# Patient Record
Sex: Female | Born: 1975 | Race: Asian | Hispanic: No | Marital: Married | Smoking: Never smoker
Health system: Southern US, Community
[De-identification: ages and names within clinical notes are randomized; demographics above are authoritative.]

## PROBLEM LIST (undated history)

## (undated) ENCOUNTER — Inpatient Hospital Stay (HOSPITAL_COMMUNITY): Payer: Self-pay

## (undated) DIAGNOSIS — S93402A Sprain of unspecified ligament of left ankle, initial encounter: Secondary | ICD-10-CM

## (undated) DIAGNOSIS — IMO0002 Reserved for concepts with insufficient information to code with codable children: Secondary | ICD-10-CM

## (undated) DIAGNOSIS — Z789 Other specified health status: Secondary | ICD-10-CM

## (undated) DIAGNOSIS — Z87440 Personal history of urinary (tract) infections: Secondary | ICD-10-CM

## (undated) HISTORY — PX: NO PAST SURGERIES: SHX2092

## (undated) HISTORY — DX: Sprain of unspecified ligament of left ankle, initial encounter: S93.402A

## (undated) HISTORY — DX: Reserved for concepts with insufficient information to code with codable children: IMO0002

## (undated) HISTORY — DX: Personal history of urinary (tract) infections: Z87.440

---

## 2001-04-21 ENCOUNTER — Encounter: Payer: Self-pay | Admitting: General Practice

## 2001-04-21 ENCOUNTER — Encounter: Admission: RE | Admit: 2001-04-21 | Discharge: 2001-04-21 | Payer: Self-pay | Admitting: General Practice

## 2001-04-23 ENCOUNTER — Encounter: Payer: Self-pay | Admitting: General Practice

## 2001-04-23 ENCOUNTER — Encounter: Admission: RE | Admit: 2001-04-23 | Discharge: 2001-04-23 | Payer: Self-pay | Admitting: General Practice

## 2005-06-02 ENCOUNTER — Inpatient Hospital Stay (HOSPITAL_COMMUNITY): Admission: AD | Admit: 2005-06-02 | Discharge: 2005-06-03 | Payer: Self-pay | Admitting: *Deleted

## 2005-06-17 ENCOUNTER — Inpatient Hospital Stay (HOSPITAL_COMMUNITY): Admission: AD | Admit: 2005-06-17 | Discharge: 2005-06-17 | Payer: Self-pay | Admitting: Obstetrics and Gynecology

## 2005-08-06 ENCOUNTER — Other Ambulatory Visit: Admission: RE | Admit: 2005-08-06 | Discharge: 2005-08-06 | Payer: Self-pay | Admitting: Obstetrics and Gynecology

## 2005-10-28 ENCOUNTER — Inpatient Hospital Stay (HOSPITAL_COMMUNITY): Admission: AD | Admit: 2005-10-28 | Discharge: 2005-10-31 | Payer: Self-pay | Admitting: Obstetrics and Gynecology

## 2005-12-05 ENCOUNTER — Inpatient Hospital Stay (HOSPITAL_COMMUNITY): Admission: AD | Admit: 2005-12-05 | Discharge: 2005-12-07 | Payer: Self-pay | Admitting: Obstetrics and Gynecology

## 2005-12-11 ENCOUNTER — Inpatient Hospital Stay (HOSPITAL_COMMUNITY): Admission: AD | Admit: 2005-12-11 | Discharge: 2005-12-12 | Payer: Self-pay | Admitting: Obstetrics and Gynecology

## 2006-11-25 IMAGING — CR DG CHEST 2V
2 series · 2 of 2 positions shown · non-contrast
Comparison: 06/02/2005

CLINICAL DATA: Shortness of breath

[view not recorded (1 of 2)]
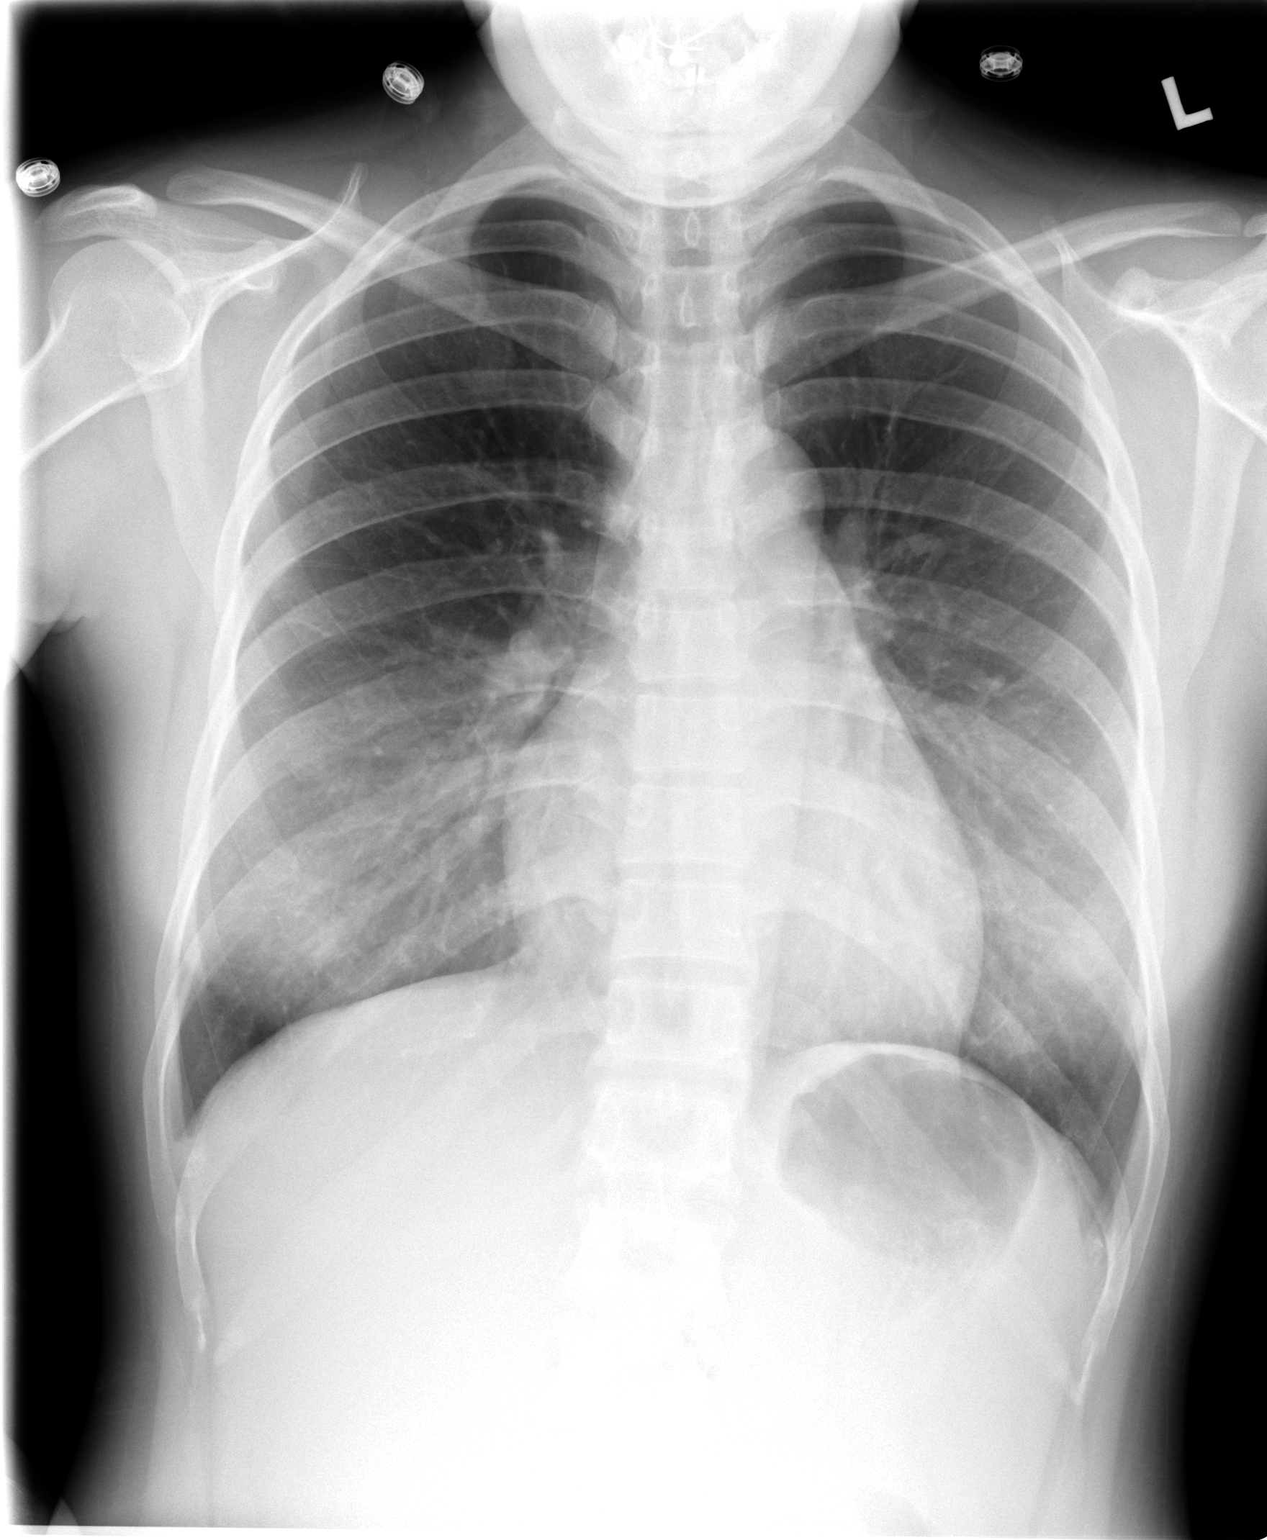

[view not recorded (2 of 2)]
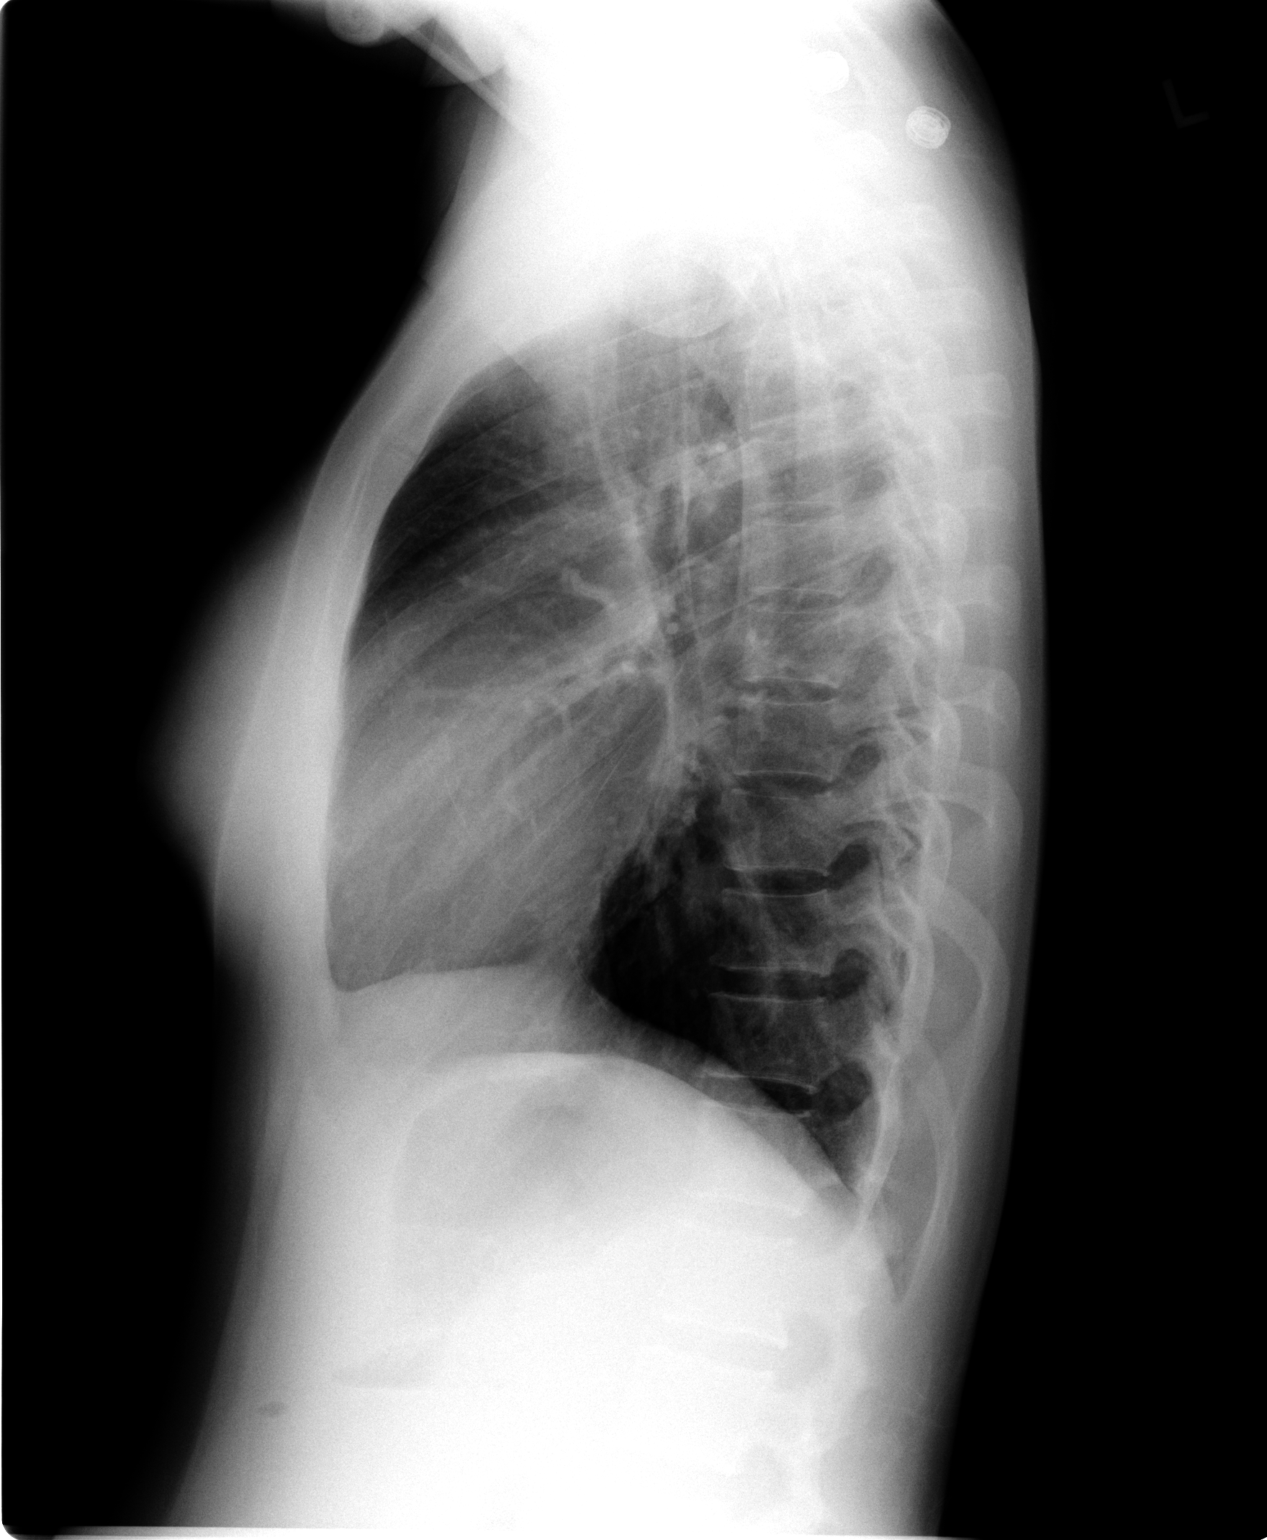

[2 of 2 positions shown; findings below may reference images not displayed]

CHEST - 2 VIEW:

The lungs are clear.  No pleural effusion. The cardiopericardial silhouette is
within normal limits for size.  Visualized bony structures of the thorax are
intact.
IMPRESSION: No acute cardiopulmonary process

## 2009-01-15 ENCOUNTER — Observation Stay (HOSPITAL_COMMUNITY): Admission: AD | Admit: 2009-01-15 | Discharge: 2009-01-16 | Payer: Self-pay | Admitting: Obstetrics and Gynecology

## 2010-06-05 LAB — CBC
HCT: 30.7 % — ABNORMAL LOW (ref 36.0–46.0)
HCT: 31.9 % — ABNORMAL LOW (ref 36.0–46.0)
Hemoglobin: 10.2 g/dL — ABNORMAL LOW (ref 12.0–15.0)
Hemoglobin: 9.8 g/dL — ABNORMAL LOW (ref 12.0–15.0)
MCHC: 31.7 g/dL (ref 30.0–36.0)
RDW: 15.1 % (ref 11.5–15.5)
RDW: 15.2 % (ref 11.5–15.5)
WBC: 14.2 10*3/uL — ABNORMAL HIGH (ref 4.0–10.5)

## 2010-06-05 LAB — ABO/RH: ABO/RH(D): B POS

## 2010-07-19 NOTE — Discharge Summary (Signed)
NAMEROSEY, EIDE NO.:  [id number]   MEDICAL RECORD NO.:  [id number]          PATIENT TYPE:  INP   LOCATION:  9115                          FACILITY:  [facility]   PHYSICIAN:  Malachi Pro. Ambrose Mantle, M.D. DATE OF BIRTH:  [birth year]   DATE OF ADMISSION:  12/05/2005  DATE OF DISCHARGE:  12/07/2005                                 DISCHARGE SUMMARY   A 35 year old Asian female para 0, gravida 1.  By a 14-week ultrasound, EDC  12/15/2005, presented with complaining of leaking fluid since 4:00 p.m. on  12/04/2005, occasional contractions, no vaginal bleeding, good fetal  movement.  She was evaluated Maternity Admission Unit with grossly ruptured  membranes, positive Nitrazine vaginal exam showed the cervix being 1 cm, 90%  vertex at a -1.   The patient was admitted and started on Pitocin.  Blood group and type B+,  negative antibody, RPR nonreactive, rubella immune, hepatitis B surface  antigen negative, GC and chlamydia negative, quad screen normal.  1-hour  Glucola 91.  Group B strep negative.  The patient transferred to our group  at approximately 21 weeks.  She had UTIs treated with Macrobid and  pyelonephritis at 33 weeks.   PAST MEDICAL HISTORY:  Negative.   SURGICAL HISTORY:  Negative.   ALLERGIES:  Rash with an antibiotic.   MEDICATIONS:  No medications.   On admission, she was afebrile with normal vital signs.  Heart/Lungs: Were  normal.  Abdomen was soft and gravid.  Estimated fetal weight about 7  pounds.   The patient, by 9:08 a.m., had 12 milliunits of Pitocin per minute going,  contractions every 2-3 minutes.  Cervix was fingertip and 80% vertex and -2.  She was 2 cm at 10:50 a.m., 6-7 cm at 12:25 p.m. and fully dilated 12:40  p.m.  She pushed well and developed a fetal bradycardia.  A left  mediolateral episiotomy was done, and the patient delivered spontaneously OA  over a left mediolateral episiotomy with third-degree laceration and  bilateral vaginal  lacerations by Dr. Ambrose Mantle.  A living female infant 7  pounds 1 ounce, Apgars 7 at 1 and 9 at 5 minutes.  Placenta was intact.  Uterus normal.  Blood loss about 400 cc.  All lacerations were sutured.  It  took four 3-0 Vicryl sutures for the lacerations in episiotomy and two 2-0  Vicryl sutures for the sphincter.   Postoperatively and postpartum the patient did well.  Her only complaint  initially was soreness of the perineum.  This improved, and on the second  postpartum day she was ready for discharge.   LABORATORY DATA:  Laboratory data showed initial hemoglobin 11.7.  Follow-up  9.2, white count 10,300, platelet count 277,000.  RPR was nonreactive.   FINAL DIAGNOSIS:  Intrauterine pregnancy at approximately 39 weeks with  premature rupture of membranes, delivered OA.   OPERATIONS:  Spontaneous delivery OA,  left mediolateral episiotomy with  third-degree extension and bilateral vaginal lacerations, all  repaired.   FINAL CONDITION:  Improved.   Instructions include our regular discharge instruction booklet.  The patient  is given a prescription of Percocet 5/325, twenty-four tablets, one every 4-  6 hours as needed for pain.  She is advised to return to the office in 6  weeks for follow-up examination.  Call with any problems.  She is also  advised to take stool softeners to keep her stools soft.      Malachi Pro. Ambrose Mantle, M.D.  Electronically Signed     TFH/MEDQ  D:  12/07/2005  T:  12/08/2005  Job:  [id number]

## 2010-07-19 NOTE — Discharge Summary (Signed)
NAMEJASMEET, [name] Boyer NO.:  [id number]   MEDICAL RECORD NO.:  [id number]          PATIENT TYPE:  INP   LOCATION:  9159                          [facility]ACILITY:  [facility]   PHYSICIAN:  Sherron Monday, MD        DATE OF BIRTH:  31-Dec-1975   DATE OF ADMISSION:  10/28/2005  DATE OF DISCHARGE:  10/31/2005                                 DISCHARGE SUMMARY   ADMITTING DIAGNOSES:  1. Intrauterine pregnancy at 32+ weeks.  2. Pyelo.   DISCHARGE DIAGNOSES:  1. Intrauterine pregnancy at 32+ weeks.  2. Pyelo, resolved.   HISTORY OF PRESENT ILLNESS:  A 35 year old G1 at 13 and 6 with complaints of  dysuria since Sunday.  She also complains of frequency and has a history of  a UTI.  She states baby has been moving well.  No loss of fluid, no vaginal  bleeding, some occasional contractions.  She was seen in the office today,  afebrile, and she was attempted to be treated outpatient with Keflex;  however, that evening they called the physician on call with a temp of  103.1.  She presented and was admitted for pyelo secondary to costovertebral  angle tenderness and fever, as well as unsuccessful management at home.   PAST MEDICAL HISTORY:  Not significant.   PAST SURGICAL HISTORY:  Not significant.   OB/GYN HISTORY:  G1 is present pregnancy, only complicated by UTI and now  pyelonephritis.  No history of any abnormal Pap smears or sexually  transmitted diseases.   MEDICATIONS:  Prenatal vitamins.   ALLERGIES:  MACROBID - causes itching and hives.   SOCIAL HISTORY:  She denies alcohol, tobacco, or drugs and is married.   FAMILY HISTORY:  Not significant for diabetes or hypertension.   PRENATAL LABS:  Hemoglobin 11.0, platelets 283, blood type B positive,  antibody screen negative, RPR nonreactive, hepatitis B surface antigen  negative, rubella immune.  Hemoglobin and electrophoresis within normal  limits.  HIV declined.  GC negative, Chlamydia negative, and a cystic  fibrosis  screen negative.   PHYSICAL EXAMINATION:  VITAL SIGNS:  She is afebrile at presentation;  however, her baby was tachycardic in the 160s, however, reactive.  She had  right costovertebral angle tenderness.  VAGINAL EXAM:  Closed, thick, and high.   She had an ultrasound to confirm her dates of her pregnancy.  On admission,  her white count was 11.4, hemoglobin 10.4, platelets 220 with 85%  polymorphonucleotides.  She was admitted for Rocephin and underwent a renal  ultrasound as well.  Her hospital course was relatively uncomplicated.  She  spiked a fever to 101.2 at approximately 4:00 in the morning on August 30.  She had had a reactive NST daily when she was here and had received the  Rocephin and noted decreasing right-sided costovertebral angle tenderness.  During her hospitalization, she did complain of headache.  Her vital signs  were stable.  She was treated successfully with Vicodin on several  occasions.  She complained of constipation as well and was treated with milk-  of-magnesia with minimal relief.  Also, she was encouraged to start a  regimen with Colace and increasing fluids at home.  She, on a urine culture,  had mixed urogenital flora, a normal renal ultrasound, and on hospital day  #3, she was discharged to home having been afebrile for greater than 36  hours, with improved costovertebral angle pain, endorsing good fetal  movement.  No loss of fluid, no bleeding, and occasional contractions.  She  was discharged to complete her prescription of Keflex as prescribed in the  office on the 28th.  She was also discharged with Protonix versus Zantac for  her heartburn, and she was given a prescription for Protonix but told either  Protonix or over-the-counter Zantac would be a good choice.  She was also  given a prescription for Colace.  Follow up appointment on September 5 at  9:45 with myself, and 15 Vicodin for headaches.  She is to call with any  questions or problems and  will continue to check her temperatures at home.  She and her husband voice understanding to these instructions.      Sherron Monday, MD  Electronically Signed     JB/MEDQ  D:  10/31/2005  T:  11/01/2005  Job:  [id number]

## 2010-09-03 ENCOUNTER — Other Ambulatory Visit (HOSPITAL_COMMUNITY): Payer: Self-pay | Admitting: Obstetrics and Gynecology

## 2010-09-03 ENCOUNTER — Ambulatory Visit (HOSPITAL_COMMUNITY)
Admission: RE | Admit: 2010-09-03 | Discharge: 2010-09-03 | Disposition: A | Discharge status: Home / Self Care | Payer: Medicaid Other | Source: Ambulatory Visit | Attending: Obstetrics and Gynecology | Admitting: Obstetrics and Gynecology

## 2010-09-03 DIAGNOSIS — Z3689 Encounter for other specified antenatal screening: Secondary | ICD-10-CM | POA: Insufficient documentation

## 2010-09-03 DIAGNOSIS — O365990 Maternal care for other known or suspected poor fetal growth, unspecified trimester, not applicable or unspecified: Secondary | ICD-10-CM | POA: Insufficient documentation

## 2010-09-04 ENCOUNTER — Inpatient Hospital Stay (HOSPITAL_COMMUNITY)
Admission: AD | Admit: 2010-09-04 | Discharge: 2010-09-06 | DRG: 775 | Disposition: A | Discharge status: Home / Self Care | Payer: Medicaid Other | Source: Ambulatory Visit | Attending: Obstetrics and Gynecology | Admitting: Obstetrics and Gynecology

## 2010-09-04 DIAGNOSIS — O4290 Premature rupture of membranes, unspecified as to length of time between rupture and onset of labor, unspecified weeks of gestation: Principal | ICD-10-CM | POA: Diagnosis present

## 2010-09-04 LAB — CBC
MCH: 22.3 pg — ABNORMAL LOW (ref 26.0–34.0)
MCHC: 33.1 g/dL (ref 30.0–36.0)
Platelets: 220 10*3/uL (ref 150–400)
RDW: 15.1 % (ref 11.5–15.5)

## 2010-09-04 LAB — RPR: RPR Ser Ql: NONREACTIVE

## 2010-09-05 LAB — CBC
MCH: 21.9 pg — ABNORMAL LOW (ref 26.0–34.0)
MCHC: 32.5 g/dL (ref 30.0–36.0)
Platelets: 183 10*3/uL (ref 150–400)

## 2010-10-03 NOTE — Discharge Summary (Signed)
NAMESARYA, LINENBERGER NO.:  [id number]  MEDICAL RECORD NO.:  [id number]  LOCATION:  9146                          [facility]ACILITY:  [facility]  PHYSICIAN:  Malachi Pro. Ambrose Mantle, M.D. DATE OF BIRTH:  [birth year]  DATE OF ADMISSION:  09/04/2010 DATE OF DISCHARGE:  09/06/2010                              DISCHARGE SUMMARY   HISTORY OF PRESENT ILLNESS:  This is a 35 year old Asian female, para 1- 0-1-1 gravida 3, EDC September 13, 2010, admitted with premature rupture of membranes at 5:58 a.m. on September 04, 2010.  Blood group and type B positive, prenatal records said B negative, but in fact it was B positive, negative antibody.  Pap smear normal, rubella immune, RPR nonreactive.  Urine culture negative, hepatitis B surface antigen negative, HIV negative, GC and Chlamydia negative, cystic fibrosis negative.  Genetic screens declined, 1-hour Glucola 125 and group B strep negative.  Vaginal ultrasound on February 06, 2010 showed an estimated gestational age of [redacted] weeks and 2 days with [facility] of September 13, 2010.  Prenatal care was uncomplicated.  Ultrasound in February 2012 showed an estimated gestational age of [redacted] weeks and 5 days with [facility] of September 11, 2010.  At 5:58 a.m. on the day of admission the patient had spontaneous rupture of membranes at home.  She came to Maternity Admission Unit and the fern was positive on September 03, 2010.  Fundal height lagged.  Ultrasound showed an estimated fetal weight in the 62nd percentile.  Nonstress test was reactive.  PAST MEDICAL HISTORY:  ALLERGIES.:  None.  MEDICATION:  For UTI, the patient does not remember the name.  ILLNESSES:  Pyelonephritis in her last pregnancy.  OPERATIONS:  None.  HABITS:  Alcohol, tobacco, and drugs none.  FAMILY HISTORY:  Negative.  OBSTETRICAL HISTORY:  In October 2007, she delivered a 7 pounds 1 ounce female vaginally with a left mediolateral episiotomy, third-degree extension, bilateral vaginal laceration.  PHYSICAL EXAM  ON ADMISSION:  VITAL SIGNS:  Normal. HEART/LUNGS:  Normal. ABDOMEN: Soft.  Fundal height 35 cm on June 12, 33 cm on September 03, 2010. Cervix was 2 cm, 50% vertex at -2.  Crist Fat was positive.  The patient was admitted for delivery.  After admission to the hospital the patient was begun on Pitocin.  By 10:15 a.m., her cervix was 2 cm, 70%.  Contractions every 2-4 minutes.  She progressed to complete dilatation and pushed for 15 minutes and delivered a living female infant 6 pounds 12 ounces by Dr. Ellyn Hack, Apgars were 9 at 1 and 9 at 5 minutes.  Infant was female.  Placenta was expressed intact.  Her right labial and second-degree perineal lacerations were repaired with 3-0 Vicryl Rapide in typical fashion.  Blood loss less than 500 mL. Postpartum, the patient did well and was discharged on the second postpartum day.  Laboratory data showed initial hemoglobin 11.7, hematocrit 35.3, white count 11,100, platelet count 220,000.  Followup hemoglobin 9.4, the patient was B positive, RPR was nonreactive.  FINAL DIAGNOSIS:  Intrauterine pregnancy at 38 weeks and 5 days. Delivered vertex, premature rupture of membranes.  OPERATIONS:  Spontaneous delivery of vertex, repair of right labial  and second-degree perineal lacerations.  FINAL CONDITION:  Improved.  INSTRUCTIONS:  Our regular discharge instruction booklet.  The patient is advised to return to the office in 6 weeks for followup examination. Percocet 5/325 30 tablets 1 every 4-6 hours as needed for pain and Motrin 600 mg 30 tablets one every 6 hours as needed for pain given at discharge.  The patient is advised to watch for signs or symptoms of postpartum depression.     Malachi Pro. Ambrose Mantle, M.D.     TFH/MEDQ  D:  09/06/2010  T:  09/06/2010  Job:  [id number]  Electronically Signed by Tracey Harries M.D. on 10/03/2010 08:18:48 AM

## 2013-08-19 ENCOUNTER — Emergency Department (HOSPITAL_COMMUNITY)
Admission: EM | Admit: 2013-08-19 | Discharge: 2013-08-20 | Disposition: A | Discharge status: Home / Self Care | Payer: BC Managed Care – PPO | Attending: Emergency Medicine | Admitting: Emergency Medicine

## 2013-08-19 ENCOUNTER — Encounter (HOSPITAL_COMMUNITY): Payer: Self-pay | Admitting: Emergency Medicine

## 2013-08-19 DIAGNOSIS — O039 Complete or unspecified spontaneous abortion without complication: Secondary | ICD-10-CM | POA: Insufficient documentation

## 2013-08-19 NOTE — ED Notes (Signed)
Pt in stating she had a positive pregnancy test last week, this morning she noted vaginal spotting, tonight around 1930 heavier bleeding started, pt states bleeding continues to be heavy with multiple clots. Unsure how many weeks pregnant she is, c/o lower abdominal cramping.

## 2013-08-20 ENCOUNTER — Emergency Department (HOSPITAL_COMMUNITY): Payer: BC Managed Care – PPO

## 2013-08-20 LAB — CBC WITH DIFFERENTIAL/PLATELET
BASOS PCT: 1 % (ref 0–1)
Basophils Absolute: 0.1 10*3/uL (ref 0.0–0.1)
EOS ABS: 0.2 10*3/uL (ref 0.0–0.7)
EOS PCT: 2 % (ref 0–5)
HEMATOCRIT: 32.4 % — AB (ref 36.0–46.0)
HEMOGLOBIN: 10.5 g/dL — AB (ref 12.0–15.0)
Lymphocytes Relative: 30 % (ref 12–46)
Lymphs Abs: 2.6 10*3/uL (ref 0.7–4.0)
MCH: 21 pg — AB (ref 26.0–34.0)
MCHC: 32.4 g/dL (ref 30.0–36.0)
MCV: 64.8 fL — ABNORMAL LOW (ref 78.0–100.0)
MONO ABS: 0.6 10*3/uL (ref 0.1–1.0)
Monocytes Relative: 7 % (ref 3–12)
NEUTROS ABS: 5.1 10*3/uL (ref 1.7–7.7)
NEUTROS PCT: 60 % (ref 43–77)
Platelets: 202 10*3/uL (ref 150–400)
RBC: 5 MIL/uL (ref 3.87–5.11)
RDW: 14.3 % (ref 11.5–15.5)
SMEAR REVIEW: ADEQUATE
WBC: 8.6 10*3/uL (ref 4.0–10.5)

## 2013-08-20 LAB — URINALYSIS, ROUTINE W REFLEX MICROSCOPIC
Glucose, UA: NEGATIVE mg/dL
KETONES UR: 40 mg/dL — AB
NITRITE: POSITIVE — AB
PROTEIN: 100 mg/dL — AB
Specific Gravity, Urine: 1.025 (ref 1.005–1.030)
Urobilinogen, UA: 1 mg/dL (ref 0.0–1.0)
pH: 6.5 (ref 5.0–8.0)

## 2013-08-20 LAB — BASIC METABOLIC PANEL
BUN: 12 mg/dL (ref 6–23)
CHLORIDE: 101 meq/L (ref 96–112)
CO2: 24 meq/L (ref 19–32)
CREATININE: 0.67 mg/dL (ref 0.50–1.10)
Calcium: 8.8 mg/dL (ref 8.4–10.5)
GFR calc Af Amer: >90 mL/min (ref >90)
GFR calc non Af Amer: >90 mL/min (ref >90)
GLUCOSE: 107 mg/dL — AB (ref 70–99)
Potassium: 3.3 mEq/L — ABNORMAL LOW (ref 3.7–5.3)
SODIUM: 140 meq/L (ref 137–147)

## 2013-08-20 LAB — WET PREP, GENITAL
Clue Cells Wet Prep HPF POC: NONE SEEN
Trich, Wet Prep: NONE SEEN
YEAST WET PREP: NONE SEEN

## 2013-08-20 LAB — TYPE AND SCREEN
ABO/RH(D): B POS
Antibody Screen: NEGATIVE

## 2013-08-20 LAB — URINE MICROSCOPIC-ADD ON

## 2013-08-20 LAB — HCG, QUANTITATIVE, PREGNANCY: HCG, BETA CHAIN, QUANT, S: 14341 m[IU]/mL — AB (ref <5)

## 2013-08-20 LAB — ABO/RH: ABO/RH(D): B POS

## 2013-08-20 MED ORDER — ACETAMINOPHEN 325 MG PO TABS
650.0000 mg | ORAL_TABLET | Freq: Once | ORAL | Status: AC
  Administered 2013-08-20: 650 mg via ORAL
  Filled 2013-08-20: qty 2

## 2013-08-20 NOTE — ED Notes (Signed)
Pt to us with  chaperone

## 2013-08-20 NOTE — ED Notes (Signed)
The pt is c/o mild cramping in her lower abd.  Alert skin warm and dry.  No distress.  Family [place] the [place]

## 2013-08-20 NOTE — ED Provider Notes (Signed)
CSN: [id number]     Arrival date & time 08/19/13  2349 History   First MD Initiated Contact with Patient 08/19/13 2353     Chief Complaint  Patient presents with  . Vaginal Bleeding  . Pregnant      (Consider location/radiation/quality/duration/timing/severity/associated sxs/prior Treatment) HPI Comments: Patient is a 38 year old 305P2 female with no significant past medical history who presents with abdominal pain and vaginal bleeding that started this morning. Symptoms started gradually and progressively worsened since the onset. Patient reports having a positive pregnancy test last week. This morning, patient started having lower abdominal cramping and vaginal spotting. Over the course of the day, the bleeding became heavier and the cramping became worse. No alleviating factors. Patient reports a history of 2 previous miscarriages.   Patient is a 38 y.o. female presenting with vaginal bleeding.  Vaginal Bleeding Associated symptoms: no abdominal pain, no dizziness, no dysuria, no fatigue, no fever and no nausea     History reviewed. No pertinent past medical history. No past surgical history on file. No family history on file. History  Substance Use Topics  . Smoking status: Not on file  . Smokeless tobacco: Not on file  . Alcohol Use: Not on file   OB History   Grav Para Term Preterm Abortions TAB SAB Ect Mult Living                 Review of Systems  Constitutional: Negative for fever, chills and fatigue.  HENT: Negative for trouble swallowing.   Eyes: Negative for visual disturbance.  Respiratory: Negative for shortness of breath.   Cardiovascular: Negative for chest pain and palpitations.  Gastrointestinal: Negative for nausea, vomiting, abdominal pain and diarrhea.  Genitourinary: Positive for vaginal bleeding and pelvic pain. Negative for dysuria and difficulty urinating.  Musculoskeletal: Negative for arthralgias and neck pain.  Skin: Negative for color change.   Neurological: Negative for dizziness and weakness.  Psychiatric/Behavioral: Negative for dysphoric mood.      Allergies  Review of patient's allergies indicates no known allergies.  Home Medications   Prior to Admission medications   Not on File   BP 104/65  Pulse 78  Temp(Src) 98 F (36.7 C) (Oral)  Resp 20  SpO2 100% Physical Exam  Nursing note and vitals reviewed. Constitutional: She is oriented to person, place, and time. She appears well-developed and well-nourished. No distress.  HENT:  Head: Normocephalic and atraumatic.  Eyes: Conjunctivae and EOM are normal.  Neck: Normal range of motion.  Cardiovascular: Normal rate and regular rhythm.  Exam reveals no gallop and no friction rub.   No murmur heard. Pulmonary/Chest: Effort normal and breath sounds normal. She has no wheezes. She has no rales. She exhibits no tenderness.  Abdominal: Soft. She exhibits no distension. There is no tenderness. There is no rebound and no guarding.  No tenderness to palpation.   Genitourinary: Vagina normal.  Moderate amount of dark red blood in vagina with associated clots. I will unable to visualize the cervical os. Mild generalized tenderness on bimanual exam.   Musculoskeletal: Normal range of motion.  Neurological: She is alert and oriented to person, place, and time. Coordination normal.  Speech is goal-oriented. Moves limbs without ataxia.   Skin: Skin is warm and dry.  Psychiatric: She has a normal mood and affect. Her behavior is normal.    ED Course  Procedures (including critical care time) Labs Review Labs Reviewed  WET PREP, GENITAL - Abnormal; Notable for the following:  WBC, Wet Prep HPF POC FEW (*)    All other components within normal limits  CBC WITH DIFFERENTIAL - Abnormal; Notable for the following:    Hemoglobin 10.5 (*)    HCT 32.4 (*)    MCV 64.8 (*)    MCH 21.0 (*)    All other components within normal limits  BASIC METABOLIC PANEL - Abnormal;  Notable for the following:    Potassium 3.3 (*)    Glucose, Bld 107 (*)    All other components within normal limits  HCG, QUANTITATIVE, PREGNANCY - Abnormal; Notable for the following:    hCG, Beta Chain, Quant, S 14341 (*)    All other components within normal limits  URINALYSIS, ROUTINE W REFLEX MICROSCOPIC - Abnormal; Notable for the following:    Color, Urine RED (*)    APPearance TURBID (*)    Hgb urine dipstick LARGE (*)    Bilirubin Urine LARGE (*)    Ketones, ur 40 (*)    Protein, ur 100 (*)    Nitrite POSITIVE (*)    Leukocytes, UA MODERATE (*)    All other components within normal limits  GC/CHLAMYDIA PROBE AMP  URINE MICROSCOPIC-ADD ON  TYPE AND SCREEN  ABO/RH    Imaging Review No results found.   EKG Interpretation None      MDM   Final diagnoses:  Miscarriage    12:38 AM Labs and urinalysis pending. Patient will have tylenol for pain. Patient will have a pelvic exam followed by OB ultrasound. Vitals stable and patient afebrile.   Patient signed out to Dr. Norlene Campbelltter pending OB ultrasound.   Emilia BeckKaitlyn Szekalski, [place] JerseyPA-C 08/22/13 [phone]

## 2013-08-20 NOTE — Discharge Instructions (Signed)
Miscarriage A miscarriage is the sudden loss of an unborn baby (fetus) before the 20th week of pregnancy. Most miscarriages happen in the first 3 months of pregnancy. Sometimes, it happens before a woman even knows she is pregnant. A miscarriage is also called a "spontaneous miscarriage" or "early pregnancy loss." Having a miscarriage can be an emotional experience. Talk with your caregiver about any questions you may have about miscarrying, the grieving process, and your future pregnancy plans. CAUSES   Problems with the fetal chromosomes that make it impossible for the baby to develop normally. Problems with the baby's genes or chromosomes are most often the result of errors that occur, by chance, as the embryo divides and grows. The problems are not inherited from the parents.  Infection of the cervix or uterus.   Hormone problems.   Problems with the cervix, such as having an incompetent cervix. This is when the tissue in the cervix is not strong enough to hold the pregnancy.   Problems with the uterus, such as an abnormally shaped uterus, uterine fibroids, or congenital abnormalities.   Certain medical conditions.   Smoking, drinking alcohol, or taking illegal drugs.   Trauma.  Often, the cause of a miscarriage is unknown.  SYMPTOMS   Vaginal bleeding or spotting, with or without cramps or pain.  Pain or cramping in the abdomen or lower back.  Passing fluid, tissue, or blood clots from the vagina. DIAGNOSIS  Your caregiver will perform a physical exam. You may also have an ultrasound to confirm the miscarriage. Blood or urine tests may also be ordered. TREATMENT   Sometimes, treatment is not necessary if you naturally pass all the fetal tissue that was in the uterus. If some of the fetus or placenta remains in the body (incomplete miscarriage), tissue left behind may become infected and must be removed. Usually, a dilation and curettage (D and C) procedure is performed.  During a D and C procedure, the cervix is widened (dilated) and any remaining fetal or placental tissue is gently removed from the uterus.  Antibiotic medicines are prescribed if there is an infection. Other medicines may be given to reduce the size of the uterus (contract) if there is a lot of bleeding.  If you have Rh negative blood and your baby was Rh positive, you will need a Rh immunoglobulin shot. This shot will protect any future baby from having Rh blood problems in future pregnancies. HOME CARE INSTRUCTIONS   Your caregiver may order bed rest or may allow you to continue light activity. Resume activity as directed by your caregiver.  Have someone help with home and family responsibilities during this time.   Keep track of the number of sanitary pads you use each day and how soaked (saturated) they are. Write down this information.   Do not use tampons. Do not douche or have sexual intercourse until approved by your caregiver.   Only take over-the-counter or prescription medicines for pain or discomfort as directed by your caregiver.   Do not take aspirin. Aspirin can cause bleeding.   Keep all follow-up appointments with your caregiver.   If you or your partner have problems with grieving, talk to your caregiver or seek counseling to help cope with the pregnancy loss. Allow enough time to grieve before trying to get pregnant again.  SEEK IMMEDIATE MEDICAL CARE IF:   You have severe cramps or pain in your back or abdomen.  You have a fever.  You pass large blood clots (walnut-sized   or larger) ortissue from your vagina. Save any tissue for your caregiver to inspect.   Your bleeding increases.   You have a thick, bad-smelling vaginal discharge.  You become lightheaded, weak, or you faint.   You have chills.  MAKE SURE YOU:  Understand these instructions.  Will watch your condition.  Will get help right away if you are not doing well or get  worse. Document Released: 08/13/2000 Document Revised: 06/14/2012 Document Reviewed: 04/08/2011 [employer] Patient Information 2015 [employer]. This information is not intended to replace advice given to you by your health care provider. Make sure you discuss any questions you have with your health care provider.  

## 2013-08-22 LAB — GC/CHLAMYDIA PROBE AMP
CT Probe RNA: NEGATIVE
GC Probe RNA: NEGATIVE

## 2013-08-23 NOTE — ED Provider Notes (Signed)
Medical screening examination/treatment/procedure(s) were conducted as a shared visit with non-physician practitioner(s) and myself.  I personally evaluated the patient during the encounter.   EKG Interpretation None     Results for orders placed during the hospital encounter of 08/19/13  WET PREP, GENITAL      Result Value Ref Range   Yeast Wet Prep HPF POC NONE SEEN  NONE SEEN   Trich, Wet Prep NONE SEEN  NONE SEEN   Clue Cells Wet Prep HPF POC NONE SEEN  NONE SEEN   WBC, Wet Prep HPF POC FEW (*) NONE SEEN  GC/CHLAMYDIA PROBE AMP      Result Value Ref Range   CT Probe RNA NEGATIVE  NEGATIVE   GC Probe RNA NEGATIVE  NEGATIVE  CBC WITH DIFFERENTIAL      Result Value Ref Range   WBC 8.6  4.0 - 10.5 K/uL   RBC 5.00  3.87 - 5.11 MIL/uL   Hemoglobin 10.5 (*) 12.0 - 15.0 g/dL   HCT 16.132.4 (*) 09.636.0 - 04.546.0 %   MCV 64.8 (*) 78.0 - 100.0 fL   MCH 21.0 (*) 26.0 - 34.0 pg   MCHC 32.4  30.0 - 36.0 g/dL   RDW 40.914.3  81.111.5 - 91.415.5 %   Platelets 202  150 - 400 K/uL   Neutrophils Relative % 60  43 - 77 %   Lymphocytes Relative 30  12 - 46 %   Monocytes Relative 7  3 - 12 %   Eosinophils Relative 2  0 - 5 %   Basophils Relative 1  0 - 1 %   Neutro Abs 5.1  1.7 - 7.7 K/uL   Lymphs Abs 2.6  0.7 - 4.0 K/uL   Monocytes Absolute 0.6  0.1 - 1.0 K/uL   Eosinophils Absolute 0.2  0.0 - 0.7 K/uL   Basophils Absolute 0.1  0.0 - 0.1 K/uL   RBC Morphology ELLIPTOCYTES     Smear Review PLATELETS APPEAR ADEQUATE    BASIC METABOLIC PANEL      Result Value Ref Range   Sodium 140  137 - 147 mEq/L   Potassium 3.3 (*) 3.7 - 5.3 mEq/L   Chloride 101  96 - 112 mEq/L   CO2 24  19 - 32 mEq/L   Glucose, Bld 107 (*) 70 - 99 mg/dL   BUN 12  6 - 23 mg/dL   Creatinine, Ser 7.820.67  0.50 - 1.10 mg/dL   Calcium 8.8  8.4 - 95.610.5 mg/dL   GFR calc non Af Amer >90  >90 mL/min   GFR calc Af Amer >90  >90 mL/min  HCG, QUANTITATIVE, PREGNANCY      Result Value Ref Range   hCG, Beta Chain, Quant, S 14341 (*) <5 mIU/mL   URINALYSIS, ROUTINE W REFLEX MICROSCOPIC      Result Value Ref Range   Color, Urine RED (*) YELLOW   APPearance TURBID (*) CLEAR   Specific Gravity, Urine 1.025  1.005 - 1.030   pH 6.5  5.0 - 8.0   Glucose, UA NEGATIVE  NEGATIVE mg/dL   Hgb urine dipstick LARGE (*) NEGATIVE   Bilirubin Urine LARGE (*) NEGATIVE   Ketones, ur 40 (*) NEGATIVE mg/dL   Protein, ur 213100 (*) NEGATIVE mg/dL   Urobilinogen, UA 1.0  0.0 - 1.0 mg/dL   Nitrite POSITIVE (*) NEGATIVE   Leukocytes, UA MODERATE (*) NEGATIVE  URINE MICROSCOPIC-ADD ON      Result Value Ref Range   WBC, UA 3-6  <  3 WBC/hpf   RBC / HPF TOO NUMEROUS TO COUNT  <3 RBC/hpf   Bacteria, UA RARE  RARE  TYPE AND SCREEN      Result Value Ref Range   ABO/RH(D) B POS     Antibody Screen NEG     Sample Expiration 08/23/2013    ABO/RH      Result Value Ref Range   ABO/RH(D) B POS     [place] Ob Comp Less 14 Wks  08/20/2013   CLINICAL DATA:  Vaginal bleeding.  EXAM: OBSTETRIC <14 WK US AND TRANSVAGINAL OB US  TECHNIQUE: Both transabdominal and transvaginal ultrasound examinations were performed for complete evaluation of the gestation as well as the maternal uterus, adnexal regions, and pelvic cul-de-sac. Transvaginal technique was performed to assess early pregnancy.  COMPARISON:  None.  FINDINGS: Intrauterine gestational sac:  None seen.  Yolk sac:  N/A  Embryo:  N/A  Maternal uterus/adnexae: The endometrial canal is diffusely thickened, measuring 2.1 cm, and contains heterogeneous material, likely reflecting clot. Nabothian cysts are seen at the cervix.  The ovaries are unremarkable in appearance. The right ovary measures 2.6 x 1.7 x 1.7 cm, while the left ovary measures 2.6 x 2.6 x 1.8 cm. No suspicious adnexal masses are seen; there is no evidence for ovarian torsion.  A small amount of free fluid is seen within the pelvic cul-de-sac.  IMPRESSION: No intrauterine gestational sac seen. No evidence for ectopic pregnancy. This likely reflects spontaneous  abortion. The endometrial canal is thickened and contains heterogeneous material, likely reflecting clot.   Electronically Signed   By: Roanna RaiderJeffery  Chang M.D.   On: 08/20/2013 02:23   [place] Ob Transvaginal  08/20/2013   CLINICAL DATA:  Vaginal bleeding.  EXAM: OBSTETRIC <14 WK US AND TRANSVAGINAL OB US  TECHNIQUE: Both transabdominal and transvaginal ultrasound examinations were performed for complete evaluation of the gestation as well as the maternal uterus, adnexal regions, and pelvic cul-de-sac. Transvaginal technique was performed to assess early pregnancy.  COMPARISON:  None.  FINDINGS: Intrauterine gestational sac:  None seen.  Yolk sac:  N/A  Embryo:  N/A  Maternal uterus/adnexae: The endometrial canal is diffusely thickened, measuring 2.1 cm, and contains heterogeneous material, likely reflecting clot. Nabothian cysts are seen at the cervix.  The ovaries are unremarkable in appearance. The right ovary measures 2.6 x 1.7 x 1.7 cm, while the left ovary measures 2.6 x 2.6 x 1.8 cm. No suspicious adnexal masses are seen; there is no evidence for ovarian torsion.  A small amount of free fluid is seen within the pelvic cul-de-sac.  IMPRESSION: No intrauterine gestational sac seen. No evidence for ectopic pregnancy. This likely reflects spontaneous abortion. The endometrial canal is thickened and contains heterogeneous material, likely reflecting clot.   Electronically Signed   By: Roanna RaiderJeffery  Chang M.D.   On: 08/20/2013 02:23    Pt with heavy bleeding, positive pregnancy test.  U/s reflects probable miscarriage.  Pt and husband updated on findings and need to f/u with her ob/gyn.  Pt is B+, no need for rhogam.  Olivia Mackielga M Otter, MD 08/23/13 [phone]

## 2013-11-11 ENCOUNTER — Emergency Department (HOSPITAL_COMMUNITY)
Admission: EM | Admit: 2013-11-11 | Discharge: 2013-11-11 | Disposition: A | Discharge status: Home / Self Care | Payer: BC Managed Care – PPO | Source: Home / Self Care | Attending: Emergency Medicine | Admitting: Emergency Medicine

## 2013-11-11 ENCOUNTER — Encounter (HOSPITAL_COMMUNITY): Payer: Self-pay | Admitting: Emergency Medicine

## 2013-11-11 DIAGNOSIS — S93409A Sprain of unspecified ligament of unspecified ankle, initial encounter: Secondary | ICD-10-CM

## 2013-11-11 DIAGNOSIS — S93402A Sprain of unspecified ligament of left ankle, initial encounter: Secondary | ICD-10-CM

## 2013-11-11 DIAGNOSIS — N3090 Cystitis, unspecified without hematuria: Secondary | ICD-10-CM

## 2013-11-11 LAB — POCT URINALYSIS DIP (DEVICE)
Bilirubin Urine: NEGATIVE
GLUCOSE, UA: NEGATIVE mg/dL
KETONES UR: NEGATIVE mg/dL
Nitrite: POSITIVE — AB
PROTEIN: 30 mg/dL — AB
SPECIFIC GRAVITY, URINE: 1.01 (ref 1.005–1.030)
Urobilinogen, UA: 0.2 mg/dL (ref 0.0–1.0)
pH: 6 (ref 5.0–8.0)

## 2013-11-11 LAB — POCT PREGNANCY, URINE: PREG TEST UR: NEGATIVE

## 2013-11-11 MED ORDER — DICLOFENAC SODIUM 75 MG PO TBEC: DELAYED_RELEASE_TABLET | ORAL | Status: DC

## 2013-11-11 MED ORDER — NITROFURANTOIN MONOHYD MACRO 100 MG PO CAPS
100.0000 mg | ORAL_CAPSULE | Freq: Two times a day (BID) | ORAL | Status: DC
Start: 2013-11-11 — End: 2013-12-27

## 2013-11-11 NOTE — ED Notes (Signed)
Reports poss UTI onset earlier this afternoon Sx include: dysuria, abd pain Denies f/v/n/d LMP = Today Also reports inversion of right foot/ankle inj Slow steady gait No signs of acute distress.

## 2013-11-11 NOTE — ED Provider Notes (Signed)
Medical screening examination/treatment/procedure(s) were performed by non-physician practitioner and as supervising physician I was immediately available for consultation/collaboration.  Dorinne Graeff, M.D.  Mitali Shenefield C Yael Coppess, MD 11/11/13 2136 

## 2013-11-11 NOTE — Discharge Instructions (Signed)
Ankle Sprain °An ankle sprain is an injury to the strong, fibrous tissues (ligaments) that hold the bones of your ankle joint together.  °CAUSES °An ankle sprain is usually caused by a fall or by twisting your ankle. Ankle sprains most commonly occur when you step on the outer edge of your foot, and your ankle turns inward. People who participate in sports are more prone to these types of injuries.  °SYMPTOMS  °· Pain in your ankle. The pain may be present at rest or only when you are trying to stand or walk. °· Swelling. °· Bruising. Bruising may develop immediately or within 1 to 2 days after your injury. °· Difficulty standing or walking, particularly when turning corners or changing directions. °DIAGNOSIS  °Your caregiver will ask you details about your injury and perform a physical exam of your ankle to determine if you have an ankle sprain. During the physical exam, your caregiver will press on and apply pressure to specific areas of your foot and ankle. Your caregiver will try to move your ankle in certain ways. An X-ray exam may be done to be sure a bone was not broken or a ligament did not separate from one of the bones in your ankle (avulsion fracture).  °TREATMENT  °Certain types of braces can help stabilize your ankle. Your caregiver can make a recommendation for this. Your caregiver may recommend the use of medicine for pain. If your sprain is severe, your caregiver may refer you to a surgeon who helps to restore function to parts of your skeletal system (orthopedist) or a physical therapist. °HOME CARE INSTRUCTIONS  °· Apply ice to your injury for 1-2 days or as directed by your caregiver. Applying ice helps to reduce inflammation and pain. °¨ Put ice in a plastic bag. °¨ Place a towel between your skin and the bag. °¨ Leave the ice on for 15-20 minutes at a time, every 2 hours while you are awake. °· Only take over-the-counter or prescription medicines for pain, discomfort, or fever as directed by  your caregiver. °· Elevate your injured ankle above the level of your heart as much as possible for 2-3 days. °· If your caregiver recommends crutches, use them as instructed. Gradually put weight on the affected ankle. Continue to use crutches or a cane until you can walk without feeling pain in your ankle. °· If you have a plaster splint, wear the splint as directed by your caregiver. Do not rest it on anything harder than a pillow for the first 24 hours. Do not put weight on it. Do not get it wet. You may take it off to take a shower or bath. °· You may have been given an elastic bandage to wear around your ankle to provide support. If the elastic bandage is too tight (you have numbness or tingling in your foot or your foot becomes cold and blue), adjust the bandage to make it comfortable. °· If you have an air splint, you may blow more air into it or let air out to make it more comfortable. You may take your splint off at night and before taking a shower or bath. Wiggle your toes in the splint several times per day to decrease swelling. °SEEK MEDICAL CARE IF:  °· You have rapidly increasing bruising or swelling. °· Your toes feel extremely cold or you lose feeling in your foot. °· Your pain is not relieved with medicine. °SEEK IMMEDIATE MEDICAL CARE IF: °· Your toes are numb or blue. °·   You have severe pain that is increasing. MAKE SURE YOU:   Understand these instructions.  Will watch your condition.  Will get help right away if you are not doing well or get worse. Document Released: 02/17/2005 Document Revised: 11/12/2011 Document Reviewed: 03/01/2011 [facility] Patient Information 2015 [place], [place]. This information is not intended to replace advice given to you by your health care provider. Make sure you discuss any questions you have with your health care provider.  Urinary Tract Infection Urinary tract infections (UTIs) can develop anywhere along your urinary tract. Your urinary tract is your  body's drainage system for removing wastes and extra water. Your urinary tract includes two kidneys, two ureters, a bladder, and a urethra. Your kidneys are a pair of bean-shaped organs. Each kidney is about the size of your fist. They are located below your ribs, one on each side of your spine. CAUSES Infections are caused by microbes, which are microscopic organisms, including fungi, viruses, and bacteria. These organisms are so small that they can only be seen through a microscope. Bacteria are the microbes that most commonly cause UTIs. SYMPTOMS  Symptoms of UTIs may vary by age and gender of the patient and by the location of the infection. Symptoms in Rhylen Shaheen women typically include a frequent and intense urge to urinate and a painful, burning feeling in the bladder or urethra during urination. Older women and men are more likely to be tired, shaky, and weak and have muscle aches and abdominal pain. A fever may mean the infection is in your kidneys. Other symptoms of a kidney infection include pain in your back or sides below the ribs, nausea, and vomiting. DIAGNOSIS To diagnose a UTI, your caregiver will ask you about your symptoms. Your caregiver also will ask to provide a urine sample. The urine sample will be tested for bacteria and white blood cells. White blood cells are made by your body to help fight infection. TREATMENT  Typically, UTIs can be treated with medication. Because most UTIs are caused by a bacterial infection, they usually can be treated with the use of antibiotics. The choice of antibiotic and length of treatment depend on your symptoms and the type of bacteria causing your infection. HOME CARE INSTRUCTIONS  If you were prescribed antibiotics, take them exactly as your caregiver instructs you. Finish the medication even if you feel better after you have only taken some of the medication.  Drink enough water and fluids to keep your urine clear or pale yellow.  Avoid caffeine,  tea, and carbonated beverages. They tend to irritate your bladder.  Empty your bladder often. Avoid holding urine for long periods of time.  Empty your bladder before and after sexual intercourse.  After a bowel movement, women should cleanse from front to back. Use each tissue only once. SEEK MEDICAL CARE IF:   You have back pain.  You develop a fever.  Your symptoms do not begin to resolve within 3 days. SEEK IMMEDIATE MEDICAL CARE IF:   You have severe back pain or lower abdominal pain.  You develop chills.  You have nausea or vomiting.  You have continued burning or discomfort with urination. MAKE SURE YOU:   Understand these instructions.  Will watch your condition.  Will get help right away if you are not doing well or get worse. Document Released: 11/27/2004 Document Revised: 08/19/2011 Document Reviewed: 03/28/2011 [facility] Patient Information 2015 [place], [place]. This information is not intended to replace advice given to you by your health care  provider. Make sure you discuss any questions you have with your health care provider. ° °

## 2013-11-11 NOTE — ED Provider Notes (Signed)
CSN: [id number]     Arrival date & time 11/11/13  2010 History   First MD Initiated Contact with Patient 11/11/13 2029     Chief Complaint  Patient presents with  . Urinary Tract Infection  . Ankle Injury   (Consider location/radiation/quality/duration/timing/severity/associated sxs/prior Treatment) HPI Comments: Patient presents with 2 complaints today.  First she has been experiencing dysuria, frequency and pressure when urinating. Feels like a "bladder infection". No vaginal discharge. No pelvic or back pain. Started menses today.  Second she stepped "wrong" off a curb and twisted her left ankle yesterday. Immediate pain left lateral side. No swelling. Can bear weight but painful.   Patient is a 38 y.o. female presenting with urinary tract infection and lower extremity injury. The history is provided by the patient.  Urinary Tract Infection  Ankle Injury    History reviewed. No pertinent past medical history. History reviewed. No pertinent past surgical history. No family history on file. History  Substance Use Topics  . Smoking status: Never Smoker   . Smokeless tobacco: Not on file  . Alcohol Use: No   OB History   Grav Para Term Preterm Abortions TAB SAB Ect Mult Living                 Review of Systems  All other systems reviewed and are negative.   Allergies  Review of patient's allergies indicates no known allergies.  Home Medications   Prior to Admission medications   Medication Sig Start Date End Date Taking? Authorizing Provider  diclofenac (VOLTAREN) 75 MG EC tablet Take 1 tablet with food as needed for ankle pain and swelling 11/11/13   Riki Sheer, PA-C  nitrofurantoin, macrocrystal-monohydrate, (MACROBID) 100 MG capsule Take 1 capsule (100 mg total) by mouth 2 (two) times daily. 11/11/13   Riki Sheer, PA-C   BP 98/70  Pulse 60  Temp(Src) 98 F (36.7 C) (Oral)  Resp 16  SpO2 100%  LMP 11/11/2013 Physical Exam  Nursing note and vitals  reviewed. Constitutional: She is oriented to person, place, and time. She appears well-developed and well-nourished. No distress.  HENT:  Head: Normocephalic and atraumatic.  Neck: Normal range of motion.  Abdominal: Soft. Bowel sounds are normal. She exhibits no distension. There is no tenderness. There is no rebound and no guarding.  Musculoskeletal:  Left ankle without ecchymosis or swelling. Full ROM in the ankle joint. Pain with medial inversion.   Neurological: She is alert and oriented to person, place, and time.  Skin: Skin is warm and dry. She is not diaphoretic.  Psychiatric: Her behavior is normal.    ED Course  Procedures (including critical care time) Labs Review Labs Reviewed  POCT URINALYSIS DIP (DEVICE) - Abnormal; Notable for the following:    Hgb urine dipstick LARGE (*)    Protein, ur 30 (*)    Nitrite POSITIVE (*)    Leukocytes, UA SMALL (*)    All other components within normal limits  POCT PREGNANCY, URINE    Imaging Review No results found.   MDM   1. Bladder infection   2. Ankle sprain, left, initial encounter    1. Cover with antibiotics. Push fluids. AZO if needed for symptoms.  2. No indication for an xray. ASO brace. Ice, rest. Diclofenac as needed for pain. F/U if worsens.     Riki Sheer, PA-C 11/11/13 2047

## 2013-12-27 ENCOUNTER — Ambulatory Visit (INDEPENDENT_AMBULATORY_CARE_PROVIDER_SITE_OTHER)
Admission: RE | Admit: 2013-12-27 | Discharge: 2013-12-27 | Disposition: A | Discharge status: Home / Self Care | Payer: BC Managed Care – PPO | Source: Ambulatory Visit | Attending: Family Medicine | Admitting: Family Medicine

## 2013-12-27 ENCOUNTER — Ambulatory Visit (INDEPENDENT_AMBULATORY_CARE_PROVIDER_SITE_OTHER): Payer: BC Managed Care – PPO | Admitting: Family Medicine

## 2013-12-27 ENCOUNTER — Encounter: Payer: Self-pay | Admitting: Family Medicine

## 2013-12-27 VITALS — BP 96/66 | HR 72 | Temp 98.1°F | Ht 58.34 in | Wt 115.7 lb

## 2013-12-27 DIAGNOSIS — S93402A Sprain of unspecified ligament of left ankle, initial encounter: Secondary | ICD-10-CM

## 2013-12-27 DIAGNOSIS — J309 Allergic rhinitis, unspecified: Secondary | ICD-10-CM

## 2013-12-27 DIAGNOSIS — Z7689 Persons encountering health services in other specified circumstances: Secondary | ICD-10-CM

## 2013-12-27 DIAGNOSIS — Z7189 Other specified counseling: Secondary | ICD-10-CM

## 2013-12-27 NOTE — Progress Notes (Signed)
Pre visit review using our clinic review tool, if applicable. No additional management support is needed unless otherwise documented below in the visit note. 

## 2013-12-27 NOTE — Progress Notes (Signed)
No chief complaint on file.   HPI:  [name] Boyer is here to establish care. From [place] - has lived in [place] 13 years. Last PCP and physical: [facility]  Has the following chronic problems and concerns today:  There are no active problems to display for this patient.  Allergic Rhinitis: -symptoms: runny nose, sneezing, PND, eye symptoms -reports so severe prior doctor advised eval in [place] -occurs rarely, has mild blurry vision for a few hours -usually when has a cold or bad allergies -in the past took Claritin, benadryl, nasal spray and eye drops for allergies -symptoms worse in the spring -she wants to see allergist her if occurs again -also has bad skin reactions - sees dermatologist for this - [facility]  Sprained: -L ankle - sprained 3 weeks ago - inversion injury -went to urgent care for it, they did not do xray, given diclofenac -swelled a lot when first happened -still hurts with walking, bearing weight in medial ankle -denies: weakness, numbness -just had menstrual period, not pregnant   ROS negative for unless reported above: fevers, unintentional weight loss, hearing or vision loss, chest pain, palpitations, struggling to breath, hemoptysis, melena, hematochezia, hematuria, falls, loc, si, thoughts of self harm  Past Medical History  Diagnosis Date  . Left ankle sprain   . History of UTI   . Arthritis     History reviewed. No pertinent past surgical history.  History reviewed. No pertinent family history.  History   Social History  . Marital Status: Married    Spouse Name: N/A    Number of Children: N/A  . Years of Education: N/A   Social History Main Topics  . Smoking status: Never Smoker   . Smokeless tobacco: None  . Alcohol Use: No  . Drug Use: No  . Sexual Activity: Yes   Other Topics Concern  . None   Social History Narrative   Work or School: homemaker      Home Situation: lives with  husband and 2 daughters (3 and 548 yo in 2015)      Spiritual Beliefs: Christian      Lifestyle: no regular exercise; lots of fruits and vegetables, meat and lots of white rice             Current outpatient prescriptions:diclofenac (VOLTAREN) 75 MG EC tablet, Take 1 tablet with food as needed for ankle pain and swelling, Disp: 30 tablet, Rfl: 0  EXAM:  Filed Vitals:   12/27/13 1120  BP: 96/66  Pulse: 72  Temp: 98.1 F (36.7 C)    Body mass index is 23.89 kg/(m^2).  GENERAL: vitals reviewed and listed above, alert, oriented, appears well hydrated and in no acute distress  HEENT: atraumatic, conjunttiva clear, no obvious abnormalities on inspection of external nose and ears  NECK: no obvious masses on inspection  LUNGS: clear to auscultation bilaterally, no wheezes, rales or rhonchi, good air movement  CV: HRRR, no peripheral edema  MS: moves all extremities without noticeable abnormality -gait normal -normal inspection of ankles and feet -TTP in area of L medial ankle joint, no other TTP, neg talar tilt test, neg ant/post drawer, normal ROM, sensation, strength, pedal pulses  PSYCH: pleasant and cooperative, no obvious depression or anxiety  ASSESSMENT AND PLAN:  Discussed the following assessment and plan:  Sprain of ankle, left, initial encounter - Plan: DG Ankle Complete Left -suspect strain - odd location for symptoms given injury, xray to exclude bony injury -  HEP, follow up in 3 weeks  Allergic rhinitis, unspecified allergic rhinitis type -no symptoms now - advised seeing allergist for testing  Encounter to establish care  -We reviewed the PMH, PSH, FH, SH, Meds and Allergies. -We provided refills for any medications we will prescribe as needed. -We addressed current concerns per orders and patient instructions. -We have asked for records for pertinent exams, studies, vaccines and notes from previous providers. -We have advised patient to follow up per  instructions below. -needs CPE with fasting labs   -Patient advised to return or notify a doctor immediately if symptoms worsen or persist or new concerns arise.  Patient Instructions  BEFORE YOU LEAVE: -schedule physical exam - come fasting to that appointment  -get xray of foot -exercises for ankle sprain  -use tylenol 500-1000mg  up to 3 times daily or the diclofenac or topical sports creams as needed for pain -do the exercises provided at least 4 days per week -follow up in 2-3 weeks if not getting better  Call the allergist for an appointment. Number provided.       Kriste BasqueKIM, Roman Sandall R.

## 2013-12-27 NOTE — Patient Instructions (Addendum)
BEFORE YOU LEAVE: -schedule physical exam - come fasting to that appointment  -get xray of foot -exercises for ankle sprain  -use tylenol 500-1000mg  up to 3 times daily or the diclofenac or topical sports creams as needed for pain -do the exercises provided at least 4 days per week -follow up in 2-3 weeks if not getting better  Call the allergist for an appointment. Number provided.

## 2014-03-20 ENCOUNTER — Telehealth: Payer: Self-pay | Admitting: Family Medicine

## 2014-03-20 NOTE — Telephone Encounter (Signed)
Congratulations on pregnancy! During pregnancy advise she see her ob/gyn - then can resume physicals here after pregnancy. Advised she follow up with her ob doc about nausea. Ok to cancel for tomorrow and do not charge.

## 2014-03-20 NOTE — Telephone Encounter (Signed)
Left message on machine returning patient's call 

## 2014-03-20 NOTE — Telephone Encounter (Signed)
Pt aware and will cb after baby!

## 2014-03-20 NOTE — Telephone Encounter (Signed)
Pt is pregnant and wants to know if she should keep this appt for her cpe tomorrow? Pt is having severe morning sickness and must eat at 7am in order to keep the nausea in check. Pt would like a cb to discuss options. Pt is seeing obgyn regularly now.

## 2014-03-21 ENCOUNTER — Encounter: Payer: BC Managed Care – PPO | Admitting: Family Medicine

## 2014-06-01 ENCOUNTER — Encounter (HOSPITAL_COMMUNITY): Payer: Self-pay | Admitting: *Deleted

## 2014-06-01 ENCOUNTER — Inpatient Hospital Stay (HOSPITAL_COMMUNITY): Payer: Medicaid Other

## 2014-06-01 ENCOUNTER — Observation Stay (HOSPITAL_COMMUNITY)
Admission: AD | Admit: 2014-06-01 | Discharge: 2014-06-02 | Disposition: A | Discharge status: Home / Self Care | Payer: Medicaid Other | Source: Ambulatory Visit | Attending: Obstetrics and Gynecology | Admitting: Obstetrics and Gynecology

## 2014-06-01 DIAGNOSIS — O021 Missed abortion: Principal | ICD-10-CM | POA: Insufficient documentation

## 2014-06-01 DIAGNOSIS — Z87440 Personal history of urinary (tract) infections: Secondary | ICD-10-CM | POA: Diagnosis not present

## 2014-06-01 DIAGNOSIS — Z3A14 14 weeks gestation of pregnancy: Secondary | ICD-10-CM | POA: Insufficient documentation

## 2014-06-01 DIAGNOSIS — IMO0002 Reserved for concepts with insufficient information to code with codable children: Secondary | ICD-10-CM

## 2014-06-01 DIAGNOSIS — O364XX0 Maternal care for intrauterine death, not applicable or unspecified: Secondary | ICD-10-CM

## 2014-06-01 MED ORDER — LACTATED RINGERS IV SOLN
INTRAVENOUS | Status: DC
  Administered 2014-06-02 (×2): via INTRAVENOUS

## 2014-06-01 NOTE — Progress Notes (Signed)
Chart reviewed in preparation for AICU admission

## 2014-06-01 NOTE — MAU Provider Note (Signed)
Chief Complaint: No chief complaint on file.   First Provider Initiated Contact with Patient 06/01/14 2147      SUBJECTIVE HPI: [name] Boyer is a 39 y.o. [id number] at 18.4 weeks by 10 week ultrasound who presents to Maternity Admissions by EMS reporting shortness of breath, dizziness and near-syncope immediately prior to coming to maternity admissions this evening. Was diagnosed with missed abortion at office visit today and is scheduled to come in tomorrow morning for Cytotec induction. Thinks that her symptoms may have been from anxiety. Reports spontaneous resolution of all symptoms. Denies history of similar episodes.  Patient initially asking to go home shortly after arrival to maternity admissions, but then asked if her Cytotec induction could be started tonight until waiting until the morning. Patient is also requesting ultrasound be repeated to confirm demise before moving forward with induction.  Past Medical History  Diagnosis Date  . Left ankle sprain   . History of UTI    Negative for history of cardiac disease, pulmonary disease or neurological disease.  OB History  Gravida Para Term Preterm AB SAB TAB Ectopic Multiple Living     # Outcome Date GA Lbr Len/2nd Weight Sex Delivery Anes PTL Lv  6 Current           5 Gravida           4 Gravida           3 SAB           2 SAB           1 SAB              Past Surgical History  Procedure Laterality Date  . No past surgeries     History   Social History  . Marital Status: Married    Spouse Name: N/A  . Number of Children: N/A  . Years of Education: N/A   Occupational History  . Not on file.   Social History Main Topics  . Smoking status: Never Smoker   . Smokeless tobacco: Not on file  . Alcohol Use: No  . Drug Use: No  . Sexual Activity: Yes   Other Topics Concern  . Not on file   Social History Narrative   Work or School: homemaker      Home Situation: lives with husband and 2 daughters  (3 and 77 yo in 2015)      Spiritual Beliefs: Christian      Lifestyle: no regular exercise; lots of fruits and vegetables, meat and lots of white rice            No current facility-administered medications on file prior to encounter.   Current Outpatient Prescriptions on File Prior to Encounter  Medication Sig Dispense Refill  . diclofenac (VOLTAREN) 75 MG EC tablet Take 1 tablet with food as needed for ankle pain and swelling 30 tablet 0   No Known Allergies  Review of Systems  Constitutional: Negative for fever, chills and diaphoresis.  Respiratory: Positive for shortness of breath. Negative for cough and wheezing.   Cardiovascular: Negative for chest pain and palpitations.  Gastrointestinal: Negative for nausea, vomiting and abdominal pain.  Musculoskeletal: Negative for falls.  Neurological: Positive for dizziness. Negative for focal weakness, seizures, loss of consciousness, weakness and headaches.  Psychiatric/Behavioral: The patient is nervous/anxious.    OBJECTIVE Blood pressure 97/54, pulse 73, temperature 98.2 F (36.8 C), resp. rate 16, last  menstrual period 12/15/2013, SpO2 99 %. GENERAL: Well-developed, well-nourished female in no acute distress.  HEART: normal rate and rhythm. No murmurs rubs or gallops. RESP: normal effort. Clear to auscultation bilaterally. GI: Abdomen soft, non-tender. Positive bowel sounds 4. Gravid. U/2. MS: Nontender, no edema NEURO: Alert and oriented SPECULUM EXAM: Deferred  LAB RESULTS Results for orders placed or performed during the hospital encounter of 06/01/14 (from the past 24 hour(s))  CBC     Status: Abnormal   Collection Time: 06/02/14  1:29 AM  Result Value Ref Range   WBC 7.9 4.0 - 10.5 K/uL   RBC 4.87 3.87 - 5.11 MIL/uL   Hemoglobin 10.6 (L) 12.0 - 15.0 g/dL   HCT 60.431.9 (L) 54.036.0 - 98.146.0 %   MCV 65.5 (L) 78.0 - 100.0 fL   MCH 21.8 (L) 26.0 - 34.0 pg   MCHC 33.2 30.0 - 36.0 g/dL   RDW 19.115.9 (H) 47.811.5 - 29.515.5 %    Platelets 225 150 - 400 K/uL  Comprehensive metabolic panel     Status: Abnormal   Collection Time: 06/02/14  1:29 AM  Result Value Ref Range   Sodium 136 135 - 145 mmol/L   Potassium 3.6 3.5 - 5.1 mmol/L   Chloride 107 96 - 112 mmol/L   CO2 24 19 - 32 mmol/L   Glucose, Bld 94 70 - 99 mg/dL   BUN 9 6 - 23 mg/dL   Creatinine, Ser 6.210.45 (L) 0.50 - 1.10 mg/dL   Calcium 8.6 8.4 - 30.810.5 mg/dL   Total Protein 6.8 6.0 - 8.3 g/dL   Albumin 3.3 (L) 3.5 - 5.2 g/dL   AST 15 0 - 37 U/L   ALT 11 0 - 35 U/L   Alkaline Phosphatase 52 39 - 117 U/L   Total Bilirubin 0.5 0.3 - 1.2 mg/dL   GFR calc non Af Amer >90 >90 mL/min   GFR calc Af Amer >90 >90 mL/min   Anion gap 5 5 - 15  Type and screen     Status: None   Collection Time: 06/02/14  1:29 AM  Result Value Ref Range   ABO/RH(D) B POS    Antibody Screen NEG    Sample Expiration 06/05/2014     IMAGING 14 week-size fetus. No cardiac activity. Subjectively normal fluid.  MAU COURSE  ASSESSMENT 1. Fetal demise before 22 weeks with retention of dead fetus   2. Fetal demise, less than 22 weeks    PLAN Admit to AICU for Cytotec induction. Support given.  GoltryVirginia Nehemias Sauceda, [place] 06/01/2014  9:46 PM

## 2014-06-01 NOTE — MAU Note (Signed)
Pt saw DR. Henley this Am. Confirmed demise. Pt scheduled for labor tomorrow. Pt felt short of breath and "almost fainted" at home. States it could be anxiety. Denies nausea, vomiting, vaginal bleeding or discharge.

## 2014-06-02 ENCOUNTER — Observation Stay (HOSPITAL_COMMUNITY): Payer: Medicaid Other | Admitting: Anesthesiology

## 2014-06-02 ENCOUNTER — Encounter (HOSPITAL_COMMUNITY): Payer: Self-pay | Admitting: *Deleted

## 2014-06-02 ENCOUNTER — Encounter (HOSPITAL_COMMUNITY)
Admission: AD | Disposition: A | Discharge status: Home / Self Care | Payer: Self-pay | Source: Ambulatory Visit | Attending: Obstetrics and Gynecology

## 2014-06-02 ENCOUNTER — Observation Stay (HOSPITAL_COMMUNITY): Payer: Medicaid Other

## 2014-06-02 DIAGNOSIS — IMO0002 Reserved for concepts with insufficient information to code with codable children: Secondary | ICD-10-CM

## 2014-06-02 DIAGNOSIS — Z87440 Personal history of urinary (tract) infections: Secondary | ICD-10-CM

## 2014-06-02 DIAGNOSIS — O021 Missed abortion: Principal | ICD-10-CM

## 2014-06-02 DIAGNOSIS — Z3A14 14 weeks gestation of pregnancy: Secondary | ICD-10-CM | POA: Diagnosis not present

## 2014-06-02 HISTORY — PX: DILATION AND EVACUATION: SHX1459

## 2014-06-02 HISTORY — DX: Reserved for concepts with insufficient information to code with codable children: IMO0002

## 2014-06-02 LAB — TYPE AND SCREEN
ABO/RH(D): B POS
Antibody Screen: NEGATIVE

## 2014-06-02 LAB — CBC
HCT: 31.9 % — ABNORMAL LOW (ref 36.0–46.0)
HCT: 28.1 % — ABNORMAL LOW (ref 36.0–46.0)
HEMOGLOBIN: 10.6 g/dL — AB (ref 12.0–15.0)
HEMOGLOBIN: 9.5 g/dL — AB (ref 12.0–15.0)
MCH: 21.8 pg — AB (ref 26.0–34.0)
MCH: 22.2 pg — AB (ref 26.0–34.0)
MCHC: 33.2 g/dL (ref 30.0–36.0)
MCHC: 33.8 g/dL (ref 30.0–36.0)
MCV: 65.5 fL — AB (ref 78.0–100.0)
MCV: 65.7 fL — ABNORMAL LOW (ref 78.0–100.0)
Platelets: 225 10*3/uL (ref 150–400)
Platelets: 152 10*3/uL (ref 150–400)
RBC: 4.87 MIL/uL (ref 3.87–5.11)
RBC: 4.28 MIL/uL (ref 3.87–5.11)
RDW: 15.9 % — ABNORMAL HIGH (ref 11.5–15.5)
RDW: 15.9 % — ABNORMAL HIGH (ref 11.5–15.5)
WBC: 7.9 10*3/uL (ref 4.0–10.5)
WBC: 16.2 10*3/uL — ABNORMAL HIGH (ref 4.0–10.5)

## 2014-06-02 LAB — COMPREHENSIVE METABOLIC PANEL
ALBUMIN: 3.3 g/dL — AB (ref 3.5–5.2)
ALT: 11 U/L (ref 0–35)
AST: 15 U/L (ref 0–37)
Alkaline Phosphatase: 52 U/L (ref 39–117)
Anion gap: 5 (ref 5–15)
BUN: 9 mg/dL (ref 6–23)
CO2: 24 mmol/L (ref 19–32)
CREATININE: 0.45 mg/dL — AB (ref 0.50–1.10)
Calcium: 8.6 mg/dL (ref 8.4–10.5)
Chloride: 107 mmol/L (ref 96–112)
GFR calc Af Amer: >90 mL/min (ref >90)
GFR calc non Af Amer: >90 mL/min (ref >90)
Glucose, Bld: 94 mg/dL (ref 70–99)
Potassium: 3.6 mmol/L (ref 3.5–5.1)
Sodium: 136 mmol/L (ref 135–145)
TOTAL PROTEIN: 6.8 g/dL (ref 6.0–8.3)
Total Bilirubin: 0.5 mg/dL (ref 0.3–1.2)

## 2014-06-02 LAB — MRSA PCR SCREENING: MRSA BY PCR: NEGATIVE

## 2014-06-02 SURGERY — DILATION AND EVACUATION, UTERUS, SECOND TRIMESTER
Anesthesia: General | Site: Vagina

## 2014-06-02 MED ORDER — ONDANSETRON HCL 4 MG/2ML IJ SOLN
INTRAMUSCULAR | Status: AC
  Filled 2014-06-02: qty 2

## 2014-06-02 MED ORDER — EPHEDRINE 5 MG/ML INJ
INTRAVENOUS | Status: AC
  Filled 2014-06-02: qty 10

## 2014-06-02 MED ORDER — ONDANSETRON HCL 4 MG PO TABS: 4.0000 mg | ORAL_TABLET | Freq: Four times a day (QID) | ORAL | Status: DC | PRN

## 2014-06-02 MED ORDER — HYDROMORPHONE HCL 1 MG/ML IJ SOLN: 0.2500 mg | INTRAMUSCULAR | Status: DC | PRN

## 2014-06-02 MED ORDER — ALUM & MAG HYDROXIDE-SIMETH 200-200-20 MG/5ML PO SUSP
30.0000 mL | ORAL | Status: DC | PRN
  Filled 2014-06-02: qty 30

## 2014-06-02 MED ORDER — FENTANYL CITRATE 0.05 MG/ML IJ SOLN
INTRAMUSCULAR | Status: AC
  Filled 2014-06-02: qty 2

## 2014-06-02 MED ORDER — PROMETHAZINE HCL 25 MG/ML IJ SOLN: 6.2500 mg | INTRAMUSCULAR | Status: DC | PRN

## 2014-06-02 MED ORDER — LIDOCAINE HCL 2 % IJ SOLN
INTRAMUSCULAR | Status: DC | PRN
  Administered 2014-06-02: 16 mL

## 2014-06-02 MED ORDER — NALOXONE HCL 0.4 MG/ML IJ SOLN: 0.4000 mg | INTRAMUSCULAR | Status: DC | PRN

## 2014-06-02 MED ORDER — HYDROMORPHONE 0.3 MG/ML IV SOLN: INTRAVENOUS | Status: DC

## 2014-06-02 MED ORDER — MIDAZOLAM HCL 2 MG/2ML IJ SOLN
INTRAMUSCULAR | Status: AC
  Filled 2014-06-02: qty 2

## 2014-06-02 MED ORDER — LIDOCAINE HCL (CARDIAC) 20 MG/ML IV SOLN
INTRAVENOUS | Status: DC | PRN
  Administered 2014-06-02: 30 mg via INTRAVENOUS

## 2014-06-02 MED ORDER — LACTATED RINGERS IV SOLN
INTRAVENOUS | Status: DC
  Administered 2014-06-02: 01:00:00 via INTRAVENOUS

## 2014-06-02 MED ORDER — ONDANSETRON HCL 4 MG/2ML IJ SOLN: 4.0000 mg | Freq: Four times a day (QID) | INTRAMUSCULAR | Status: DC | PRN

## 2014-06-02 MED ORDER — GUAIFENESIN 100 MG/5ML PO SOLN
15.0000 mL | ORAL | Status: DC | PRN
Start: 2014-06-02 — End: 2014-06-02

## 2014-06-02 MED ORDER — DEXAMETHASONE SODIUM PHOSPHATE 10 MG/ML IJ SOLN
INTRAMUSCULAR | Status: DC | PRN
  Administered 2014-06-02: 4 mg via INTRAVENOUS

## 2014-06-02 MED ORDER — SODIUM CHLORIDE 0.9 % IJ SOLN: 9.0000 mL | INTRAMUSCULAR | Status: DC | PRN

## 2014-06-02 MED ORDER — MISOPROSTOL 200 MCG PO TABS
400.0000 ug | ORAL_TABLET | ORAL | Status: DC
  Filled 2014-06-02: qty 2

## 2014-06-02 MED ORDER — PROPOFOL 10 MG/ML IV BOLUS
INTRAVENOUS | Status: AC
  Filled 2014-06-02: qty 20

## 2014-06-02 MED ORDER — EPHEDRINE SULFATE 50 MG/ML IJ SOLN
INTRAMUSCULAR | Status: DC | PRN
  Administered 2014-06-02 (×3): 5 mg via INTRAVENOUS

## 2014-06-02 MED ORDER — MENTHOL 3 MG MT LOZG: 1.0000 | LOZENGE | OROMUCOSAL | Status: DC | PRN

## 2014-06-02 MED ORDER — LIDOCAINE HCL 2 % IJ SOLN
INTRAMUSCULAR | Status: AC
  Filled 2014-06-02: qty 20

## 2014-06-02 MED ORDER — PROPOFOL 10 MG/ML IV BOLUS
INTRAVENOUS | Status: DC | PRN
  Administered 2014-06-02: 20 mg via INTRAVENOUS
  Administered 2014-06-02: 150 mg via INTRAVENOUS

## 2014-06-02 MED ORDER — DIPHENHYDRAMINE HCL 12.5 MG/5ML PO ELIX
12.5000 mg | ORAL_SOLUTION | Freq: Four times a day (QID) | ORAL | Status: DC | PRN
  Filled 2014-06-02: qty 5

## 2014-06-02 MED ORDER — IBUPROFEN 800 MG PO TABS
800.0000 mg | ORAL_TABLET | Freq: Three times a day (TID) | ORAL | Status: DC | PRN
  Administered 2014-06-02: 800 mg via ORAL
  Filled 2014-06-02: qty 1

## 2014-06-02 MED ORDER — KETOROLAC TROMETHAMINE 30 MG/ML IJ SOLN
30.0000 mg | Freq: Once | INTRAMUSCULAR | Status: AC
  Administered 2014-06-02: 30 mg via INTRAVENOUS
  Filled 2014-06-02: qty 1

## 2014-06-02 MED ORDER — MEPERIDINE HCL 25 MG/ML IJ SOLN: 6.2500 mg | INTRAMUSCULAR | Status: DC | PRN

## 2014-06-02 MED ORDER — OXYCODONE-ACETAMINOPHEN 5-325 MG PO TABS: 1.0000 | ORAL_TABLET | ORAL | Status: DC | PRN

## 2014-06-02 MED ORDER — LACTATED RINGERS IV SOLN: INTRAVENOUS | Status: DC

## 2014-06-02 MED ORDER — SIMETHICONE 80 MG PO CHEW: 80.0000 mg | CHEWABLE_TABLET | Freq: Four times a day (QID) | ORAL | Status: DC | PRN

## 2014-06-02 MED ORDER — OXYCODONE-ACETAMINOPHEN 5-325 MG PO TABS
1.0000 | ORAL_TABLET | ORAL | Status: DC | PRN
  Administered 2014-06-02: 1 via ORAL
  Filled 2014-06-02: qty 1

## 2014-06-02 MED ORDER — DEXAMETHASONE SODIUM PHOSPHATE 4 MG/ML IJ SOLN
INTRAMUSCULAR | Status: AC
  Filled 2014-06-02: qty 1

## 2014-06-02 MED ORDER — IBUPROFEN 800 MG PO TABS: 800.0000 mg | ORAL_TABLET | Freq: Three times a day (TID) | ORAL | Status: DC | PRN

## 2014-06-02 MED ORDER — DEXTROSE 5 % IV SOLN
2.0000 g | INTRAVENOUS | Status: AC
  Administered 2014-06-02: 2 g via INTRAVENOUS
  Filled 2014-06-02: qty 2

## 2014-06-02 MED ORDER — LIDOCAINE HCL (CARDIAC) 20 MG/ML IV SOLN
INTRAVENOUS | Status: AC
  Filled 2014-06-02: qty 5

## 2014-06-02 MED ORDER — DIPHENHYDRAMINE HCL 50 MG/ML IJ SOLN: 12.5000 mg | Freq: Four times a day (QID) | INTRAMUSCULAR | Status: DC | PRN

## 2014-06-02 MED ORDER — FENTANYL CITRATE 0.05 MG/ML IJ SOLN
INTRAMUSCULAR | Status: DC | PRN
  Administered 2014-06-02: 25 ug via INTRAVENOUS
  Administered 2014-06-02: 100 ug via INTRAVENOUS
  Administered 2014-06-02: 25 ug via INTRAVENOUS

## 2014-06-02 MED ORDER — DEXTROSE 5 % IV SOLN
2.0000 g | INTRAVENOUS | Status: DC | PRN
  Administered 2014-06-02: 2 g via INTRAVENOUS

## 2014-06-02 MED ORDER — ONDANSETRON HCL 4 MG/2ML IJ SOLN
INTRAMUSCULAR | Status: DC | PRN
  Administered 2014-06-02: 4 mg via INTRAVENOUS

## 2014-06-02 MED ORDER — MIDAZOLAM HCL 2 MG/2ML IJ SOLN
INTRAMUSCULAR | Status: DC | PRN
  Administered 2014-06-02: 2 mg via INTRAVENOUS

## 2014-06-02 SURGICAL SUPPLY — 19 items
CATH ROBINSON RED A/P 16FR (CATHETERS) ×4 IMPLANT
CLOTH BEACON ORANGE TIMEOUT ST (SAFETY) ×4 IMPLANT
DECANTER SPIKE VIAL GLASS SM (MISCELLANEOUS) ×4 IMPLANT
GLOVE BIO SURGEON STRL SZ 6.5 (GLOVE) ×3 IMPLANT
GLOVE BIO SURGEONS STRL SZ 6.5 (GLOVE) ×1
GOWN STRL REUS W/TWL LRG LVL3 (GOWN DISPOSABLE) ×8 IMPLANT
KIT BERKELEY 1ST TRIMESTER 3/8 (MISCELLANEOUS) ×4 IMPLANT
NS IRRIG 1000ML POUR BTL (IV SOLUTION) ×4 IMPLANT
PACK VAGINAL MINOR WOMEN LF (CUSTOM PROCEDURE TRAY) ×4 IMPLANT
PAD OB MATERNITY 4.3X12.25 (PERSONAL CARE ITEMS) ×4 IMPLANT
PAD PREP 24X48 CUFFED NSTRL (MISCELLANEOUS) ×4 IMPLANT
SET BERKELEY SUCTION TUBING (SUCTIONS) ×4 IMPLANT
TOWEL OR 17X24 6PK STRL BLUE (TOWEL DISPOSABLE) ×8 IMPLANT
TUBE VACURETTE 2ND TRIMESTER (CANNULA) ×4 IMPLANT
VACURETTE 10 RIGID CVD (CANNULA) IMPLANT
VACURETTE 14MM CVD 1/2 BASE (CANNULA) ×4 IMPLANT
VACURETTE 7MM CVD STRL WRAP (CANNULA) IMPLANT
VACURETTE 8 RIGID CVD (CANNULA) IMPLANT
VACURETTE 9 RIGID CVD (CANNULA) IMPLANT

## 2014-06-02 NOTE — Progress Notes (Signed)
Day of Surgery Procedure(s) (LRB):  DILATATION AND EVACUATION (D&E) 2ND TRIMESTER (N/A)  Subjective: Patient reports tolerating PO and no problems voiding.  Pain controlled    Objective: I have reviewed patient's vital signs, intake and output and medications.  General: alert and no distress GI: soft, non-tender; bowel sounds normal; no masses,  no organomegaly  Assessment: s/p Procedure(s): DILATATION AND EVACUATION (D&E) 2ND TRIMESTER (N/A): stable, progressing well and tolerating diet  Plan: Advance diet Encourage ambulation Advance to PO medication Discharge home with Motrin, percocet, MVI  LOS: 0 days    Bovard-Stuckert, Lesette Frary 06/02/2014, 6:02 AM

## 2014-06-02 NOTE — Progress Notes (Signed)
Pt. Is discharged in the care of husband,withN.T. Escort. Denies any pain or discomfort.Stable. Emotional support given due to lost.. Chaplin in and visited. , pt verbalized fears about lost. Discharged instructions with rx were given to pt with good understanding

## 2014-06-02 NOTE — Progress Notes (Signed)
   06/02/14 1600  Clinical Encounter Type  Visited With Patient and family together (husband Jomarie LongsJoseph, three siblings)  Visit Type Follow-up;Spiritual support;Social support  Referral From Chaplain  Spiritual Encounters  Spiritual Needs Grief support;Emotional  Stress Factors  Patient Stress Factors Loss  Family Stress Factors Loss   Followed up with Lucretia FieldZila and Jomarie LongsJoseph to offer further support, including pastoral presence, reflective listening, prayer shawl as tangible sign of care and comfort, and grief education (talking with children, making a self-care plan, receiving support from others, talking to doctor frankly at f/u appointment, signs for concern, appetite/sleep/etc).  Pt and family very appreciative of comprehensive, compassionate support and aware of ongoing chaplain availability for f/u bereavement support if desired.  [address]Chaplain Mikhail Hallenbeck [place]wood[place]unde[place], [place] [phone]

## 2014-06-02 NOTE — Progress Notes (Signed)
I spent time with [name] Boyer, [name] Boyer and [name] Boyer's sister.  They were very tearful during our time together and they are grieving their baby.  It was distressing to them that they did not know the gender.  They are hoping to get that information so that they can name their baby.  They are concerned also about their 553 and 37-year-old daughters.  I helped them think through how to support their daughters while also grieving themselves.  I also provided them with [facility] information so that they had a resource for their children.  Please page if needs arise before discharge.  [employer] CorporationChaplain Katy Catalena Stanhope Pager, [phone] 3:28 PM    06/02/14 1500  Clinical Encounter Type  Visited With Patient;Patient and family together  Visit Type Spiritual support  Referral From Nurse  Stress Factors  Patient Stress Factors Loss

## 2014-06-02 NOTE — Anesthesia Postprocedure Evaluation (Signed)
Anesthesia Post Note  Patient: [name] Boyer  Procedure(s) Performed: Procedure(s) (LRB): DILATATION AND EVACUATION (D&E) 2ND TRIMESTER (N/A)  Anesthesia type: General  Patient location: PACU  Post pain: Pain level controlled  Post assessment: Post-op Vital signs reviewed  Last Vitals:  Filed Vitals:   06/02/14 0504  BP: 94/53  Pulse: 83  Temp: 37.4 C  Resp: 12    Post vital signs: Reviewed  Level of consciousness: sedated  Complications: No apparent anesthesia complications

## 2014-06-02 NOTE — Progress Notes (Signed)
We received a consult to see pt who experienced a loss early this morning.  Pt was asleep at time of visit, but will attempt follow up today.  Please page before discharge so that we can offer emotional/spiritual support.  [employer] CorporationChaplain Katy Evalynn Hankins Pager, [phone] 11:33 AM

## 2014-06-02 NOTE — Anesthesia Postprocedure Evaluation (Signed)
Anesthesia Post Note  Patient: [name] Boyer  Procedure(s) Performed: Procedure(s): DILATATION AND EVACUATION (D&E) 2ND TRIMESTER (N/A)  Anesthesia type: General  Patient location: Women's Unit  Post pain: Pain level controlled  Post assessment: Post-op Vital signs reviewed  Last Vitals: BP 88/50 mmHg  Pulse 75  Temp(Src) 36.9 C (Axillary)  Resp 16  SpO2 96%  LMP 01/29/2014 (Approximate)  Post vital signs: Reviewed  Level of consciousness: awake  Complications: No apparent anesthesia complications

## 2014-06-02 NOTE — Transfer of Care (Signed)
Immediate Anesthesia Transfer of Care Note  Patient: [name] Boyer  Procedure(s) Performed: Procedure(s): DILATATION AND EVACUATION (D&E) 2ND TRIMESTER (N/A)  Patient Location: PACU  Anesthesia Type:General  Level of Consciousness: awake  Airway & Oxygen Therapy: Patient Spontanous Breathing and Patient connected to nasal cannula oxygen  Post-op Assessment: Report given to RN and Post -op Vital signs reviewed and stable  Post vital signs: Reviewed and stable  Last Vitals:  Filed Vitals:   06/02/14 0100  BP: 107/64  Pulse: 88  Temp:   Resp:     Complications: No apparent anesthesia complications

## 2014-06-02 NOTE — Op Note (Signed)
NAMLesia Sago:  Cajas, Antwan                   ACCOUNT NO.:  [id number]  MEDICAL RECORD NO.:  [id number]  LOCATION:  [facility]                          [facility]:  [facility]  PHYSICIAN:  Sherron MondayJody Bovard, MD        DATE OF BIRTH:  1976-01-28  DATE OF PROCEDURE:  06/02/2014 DATE OF DISCHARGE:                              OPERATIVE REPORT   PREOPERATIVE DIAGNOSIS:  Eighteen-week by dates pregnancy, measuring 14 weeks fetal demise.  POSTOPERATIVE DIAGNOSIS:  Eighteen-week by dates pregnancy, measuring 14 with fetal demise.  PROCEDURE:  Dilation and evacuation with ultrasound guidance.  SURGEON:  Sherron MondayJody Bovard, MD.  ANESTHESIA:  General and 16 mL of 2% lidocaine for a paracervical block.  ESTIMATED BLOOD LOSS:  Approximately 200 mL.  IV FLUIDS:  Approximately 1100 mL.  URINE OUTPUT:  30 mL.  COMPLICATIONS:  None.  PATHOLOGY:  Products of conception to Pathology.  DESCRIPTION OF PROCEDURE:  After informed consent was reviewed with the patient and her spouse, the appropriate lab work was obtained, she was transported to the OR, placed in the table in supine position.  General anesthesia was induced and found to be adequate.  She was then placed in the [place] stirrups, was prepped and draped in the normal sterile fashion.  Using an open-sided speculum, her cervix was easily visualized and 16 mL of 2% lidocaine was used for paracervical block.  The anterior lip of the cervix was grasped with a single-tooth tenaculum.  The cervix was then dilated to accommodate a 14-French curette to 43.  With ultrasound guidance, the curette was placed into her uterus and there were several passes.  The products of conception were removed also with the aid of a small sponge stick.  The ultrasound visualization of the uterus revealed that it was clear of tissue.  An additional pass was made to clear out any blood.  The curette was removed.  The single-tooth tenaculum was removed from the anterior lip of the cervix.   Pressure was held to make hemostatic.  Speculum was then removed.  The patient was returned to the supine position.  Sponge, lap, and needle counts were correct x2.  She tolerated the procedure well.     Sherron MondayJody Bovard, MD     JB/MEDQ  D:  06/02/2014  T:  06/02/2014  Job:  [id number]

## 2014-06-02 NOTE — Anesthesia Preprocedure Evaluation (Signed)
Anesthesia Evaluation  Patient identified by MRN, date of birth, ID band Patient awake    Reviewed: Allergy & Precautions, H&P , NPO status , Patient's Chart, lab work & pertinent test results  History of Anesthesia Complications (+) MALIGNANT HYPERTHERMIA  Airway Mallampati: II  TM Distance: >3 FB Neck ROM: full    Dental no notable dental hx.    Pulmonary neg pulmonary ROS,    Pulmonary exam normal       Cardiovascular negative cardio ROS      Neuro/Psych negative neurological ROS  negative psych ROS   GI/Hepatic negative GI ROS, Neg liver ROS,   Endo/Other  negative endocrine ROS  Renal/GU negative Renal ROS     Musculoskeletal   Abdominal Normal abdominal exam  (+)   Peds  Hematology negative hematology ROS (+)   Anesthesia Other Findings   Reproductive/Obstetrics (+) Pregnancy                             Anesthesia Physical Anesthesia Plan  ASA: II and emergent  Anesthesia Plan: General   Post-op Pain Management:    Induction: Intravenous  Airway Management Planned: LMA  Additional Equipment:   Intra-op Plan:   Post-operative Plan:   Informed Consent: I have reviewed the patients History and Physical, chart, labs and discussed the procedure including the risks, benefits and alternatives for the proposed anesthesia with the patient or authorized representative who has indicated his/her understanding and acceptance.     Plan Discussed with: CRNA and Surgeon  Anesthesia Plan Comments:         Anesthesia Quick Evaluation

## 2014-06-02 NOTE — Addendum Note (Signed)
Addendum  created 06/02/14 [id number] by Graciela HusbandsWynn O Mayes Sangiovanni, CRNA   Modules edited: Notes Section   Notes Section:  File: [id number]

## 2014-06-02 NOTE — H&P (Addendum)
[name] Boyer is a 39 y.o. female [id number] at 18wk with IUFD.  On presentation to office today at 18 weeks found to have IUFD, CRL measuring 13-14wk.  Pt discussed r/b/a and options for management with Dr. Ambrose MantleHenley.  Opted for cytotec induction.  Pt desires fetus to be delivered in one piece.  Will give vaginal cytotec 400mcg q 3 hr. Was to be admitted this am 06/02/14, presents after panic attack, desires IOL to start tonight.   On discussion with patient and husband regarding POC they desire D&C as they may need anyway and want to be done quicker.  D/w pt at length r/b/a of D&E - including bleeding, infection, damage to cervix and uterus.  D/W pt with D&E fetus will be not in one piece.  Will not be able to see fetus.    History POBGYNHx [id number]  SVD x 2 SAB x 3, including this pregnancy No abn pap No STD  Past Medical History  Diagnosis Date  . Left ankle sprain   . History of UTI    Past Surgical History  Procedure Laterality Date  . No past surgeries     Family History: family history is not on file. Social History:  reports that she has never smoked. She does not have any smokeless tobacco history on file. She reports that she does not drink alcohol or use illicit drugs.married, homemaker Meds PNV and Diclegis All NKDA  Review of Systems  Constitutional: Negative.   HENT: Negative.   Eyes: Negative.   Respiratory: Negative.   Gastrointestinal: Positive for heartburn.  Genitourinary: Negative.   Musculoskeletal: Negative.   Skin: Negative.   Neurological: Negative.   Psychiatric/Behavioral: Negative.   On admission, c/o HA    Blood pressure 108/61, pulse 76, temperature 98.7 F (37.1 C), temperature source Oral, resp. rate 16, last menstrual period 12/15/2013, SpO2 99 %. Maternal Exam:  Abdomen: Patient reports no abdominal tenderness. Fundal height is appropriate for gestation.    Introitus: Normal vulva. Normal vagina.    Physical Exam  Constitutional: She is oriented to  person, place, and time. She appears well-developed and well-nourished.  HENT:  Head: Normocephalic and atraumatic.  Cardiovascular: Normal rate and regular rhythm.   Respiratory: Effort normal and breath sounds normal. No respiratory distress. She has no wheezes.  GI: Soft. Bowel sounds are normal. She exhibits no distension. There is no tenderness.  Musculoskeletal: Normal range of motion.  Neurological: She is alert and oriented to person, place, and time.  Skin: Skin is warm and dry.  Psychiatric: She has a normal mood and affect. Her behavior is normal.    Prenatal labs: ABO, Rh: --/--/B POS, B POS (06/20 0010) Antibody: NEG (06/20 0010) Rubella:  immune RPR:   NR HBsAg:   neg HIV:   neg GBS:   unknown  Hgb 11.2/ Plt 292K/ Ur Cx neg/GC neg/Chl neg/Varicell [place]/Hgb electro WNL/Pap WNL/CF neg  Assessment/Plan: 64ZH [id number] G5P2032 with IUFD at 13-14wk for cytotec iol, d/w pt r/b/a toradol for HA Will schedule for D&E with US guidance CBC, Type and Screen   Bovard-Stuckert, Keyari Kleeman 06/02/2014, 12:32 AM

## 2014-06-02 NOTE — Discharge Summary (Signed)
Obstetric Discharge Summary Reason for Admission: IUFD Prenatal Procedures: ultrasound Intrapartum Procedures: D&E with US guidance Postpartum Procedures: none Complications-Operative and Postpartum: none HEMOGLOBIN  Date Value Ref Range Status  06/02/2014 10.6* 12.0 - 15.0 g/dL Final   HCT  Date Value Ref Range Status  06/02/2014 31.9* 36.0 - 46.0 % Final    Physical Exam:  General: alert Lochia: appropriate Uterine Fundus: firm  Discharge Diagnoses: s/p D&E, stable  Discharge Information: Date: 06/02/2014 Activity: pelvic rest Diet: routine Medications: Ibuprofen, Percocet and MVI Condition: stable Instructions: refer to practice specific booklet Discharge to: home Follow-up Information    Follow up with Bovard-Stuckert, Marine Lezotte, MD. Schedule an appointment as soon as possible for a visit in 2 weeks.   Specialty:  Obstetrics and Gynecology   Why:  for postoperative check (or Dr Ambrose MantleHenley)   Contact information:   [address] [place] [address] [phone]        Bovard-Stuckert, Augusto GambleJody 06/02/2014, 6:51 AM

## 2014-06-02 NOTE — H&P (Signed)
NAMLesia Sago:  Boyer, Boyer                   ACCOUNT NO.:  [id number]  MEDICAL RECORD NO.:  [id number]  LOCATION:                                 FACILITY:  PHYSICIAN:  Malachi Prohomas F. Ambrose MantleHenley, M.D.      DATE OF BIRTH:  DATE OF ADMISSION:  06/01/2014 DATE OF DISCHARGE:                             HISTORY & PHYSICAL   HISTORY OF PRESENT ILLNESS:  This is a 39 year old female para 2-0-2-2, gravida 5, who is at 18 weeks and 4 days gestation, but ultrasound confirms that the baby is 13 to 14 weeks and not living.  Blood group and type B positive, negative antibody.  RPR negative.  Urine culture negative.  Hepatitis B surface antigen negative.  HIV negative.  GC and Chlamydia negative.  Varicella and rubella immune.  Hemoglobin AA.  Pap smear normal.  This patient had her new OB work on April 05, 2014. Ultrasound on that day showed the baby to be 10 weeks and 2 days. Patient stated she would discuss screening tests with her husband.  She elected not to have the screening tests.  On May 03, 2014, the fundal height was 8 cm.  Fetal heart tones were 150.  Patient returned on June 01, 2014, for her 18-week 3-day visit.  Fetal heart tones were not heard.  They were not seen on ultrasound.  She had an official ultrasound with our ultrasonographer showed the baby to be 13 to 14 weeks and completely nonviable.  The patient was given her options how to proceed and she chose to receive prostaglandins for induction of abortion.  PAST MEDICAL HISTORY:  She has had an itchy dry skin since pregnant, had pyelonephritis with her first pregnancy.  Cystic fibrosis carrier screen was negative in 2007.  PAST SURGICAL HISTORY:  None.  ALLERGIES:  No latex allergy.  No drug allergies.  No food allergies.  SOCIAL HISTORY:  The patient never smoked.  Does not drink.  Does not take illegal drugs.  [place] is from [place].  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  She is a Futures traderhomemaker.  FAMILY HISTORY:   Mother, no problems; sister, thyroid dysfunction.  PHYSICAL EXAMINATION:  On admission: VITAL SIGNS:  Blood pressure 88/60, weight 112, pulse 70. HEART:  Normal size and sounds.  No murmurs. LUNGS:  Clear to auscultation. ABDOMEN:  Soft.  Fundal height is hard to distinguish.  Repeat blood pressure 102/60.  The baby is nonviable, having no movement, no heart rate, and measures 13 to 14 weeks.  ADMITTING IMPRESSION:  Intrauterine pregnancy, 18 weeks 4 days by dates, 13 to 14 weeks by size.  Patient is admitted for prostaglandin induction of labor.   After this dictation and before I was to admit her the patient was admitted by Dr.Bovard and underwent D&C SO i DID NOT ADMIT THE PATIENT AND THIS h&p SHOULD BE VOIDED Malachi Prohomas F. Ambrose MantleHenley, M.D.     TFH/MEDQ  D:  06/01/2014  T:  06/01/2014  Job:  [id number]

## 2014-06-02 NOTE — Brief Op Note (Signed)
06/01/2014 - 06/02/2014  3:28 AM  PATIENT:  [name] Boyer  39 y.o. female  PRE-OPERATIVE DIAGNOSIS:  14 Week Fetal Demise  POST-OPERATIVE DIAGNOSIS:  14 Week Fetal Demise  PROCEDURE:  Procedure(s): DILATATION AND EVACUATION (D&E) 2ND TRIMESTER (N/A) with US guidance  SURGEON:  Surgeon(s) and Role:    * Bowe Sidor Bovard-Stuckert, MD - Primary  ANESTHESIA:   general and cervical block  EBL:  Total I/O In: 1091.7 [I.V.:1091.7] Out: 330 [Urine:130; Blood:200]  BLOOD ADMINISTERED:none  DRAINS: none   LOCAL MEDICATIONS USED:  LIDOCAINE 2% and Amount: 16 ml  SPECIMEN:  Source of Specimen:  POC  DISPOSITION OF SPECIMEN:  PATHOLOGY  COUNTS:  YES  TOURNIQUET:  * No tourniquets in log *  DICTATION: .Other Dictation: Dictation Number [id number]  PLAN OF CARE: Admit for overnight observation  PATIENT DISPOSITION:  PACU - hemodynamically stable.   Delay start of Pharmacological VTE agent (>24hrs) due to surgical blood loss or risk of bleeding: not applicable

## 2014-06-05 ENCOUNTER — Encounter (HOSPITAL_COMMUNITY): Payer: Self-pay | Admitting: Obstetrics and Gynecology

## 2014-06-26 ENCOUNTER — Ambulatory Visit (INDEPENDENT_AMBULATORY_CARE_PROVIDER_SITE_OTHER): Payer: 59 | Admitting: Family Medicine

## 2014-06-26 ENCOUNTER — Encounter: Payer: Self-pay | Admitting: Family Medicine

## 2014-06-26 VITALS — BP 92/60 | HR 86 | Temp 98.1°F | Ht 58.34 in | Wt 118.7 lb

## 2014-06-26 DIAGNOSIS — J309 Allergic rhinitis, unspecified: Secondary | ICD-10-CM

## 2014-06-26 MED ORDER — FLUTICASONE PROPIONATE 50 MCG/ACT NA SUSP
2.0000 | Freq: Every day | NASAL | Status: DC
Start: 2014-06-26 — End: 2015-06-12

## 2014-06-26 NOTE — Patient Instructions (Addendum)
Flonase 2 sprays daily for 1 month, then 1 spray daily each nostril  Zyrtec 10mg  once daily before bed  If this is not working I would advise Allergy testing - please call the allergy office to schedule

## 2014-06-26 NOTE — Progress Notes (Signed)
   HPI:  Rhinosinusitis: -started: chronic, worse in the spring  -symptoms: nasal, sneezing, PND, itchy, cough, itchy eyes -denies: SOB, wheezing, DOE -current treatments: none -she requested referral to allergist at her initial visit with use and we gave her a number for this    ROS: See pertinent positives and negatives per HPI.  Past Medical History  Diagnosis Date  . Left ankle sprain   . History of UTI     Past Surgical History  Procedure Laterality Date  . No past surgeries    . Dilation and evacuation N/A 06/02/2014    Procedure: DILATATION AND EVACUATION (D&E) 2ND TRIMESTER;  Surgeon: Sherian ReinJody Bovard-Stuckert, MD;  Location: [facility];  Service: Gynecology;  Laterality: N/A;    No family history on file.  History   Social History  . Marital Status: Married    Spouse Name: N/A  . Number of Children: N/A  . Years of Education: N/A   Social History Main Topics  . Smoking status: Never Smoker   . Smokeless tobacco: Not on file  . Alcohol Use: No  . Drug Use: No  . Sexual Activity: Yes   Other Topics Concern  . None   Social History Narrative   Work or School: homemaker      Home Situation: lives with husband and 2 daughters (3 and 818 yo in 2015)      Spiritual Beliefs: Christian      Lifestyle: no regular exercise; lots of fruits and vegetables, meat and lots of white rice              Current outpatient prescriptions:  .  Prenatal Vit-Fe Fumarate-FA (PRENATAL MULTIVITAMIN) TABS tablet, Take 1 tablet by mouth daily., Disp: , Rfl:  .  fluticasone (FLONASE) 50 MCG/ACT nasal spray, Place 2 sprays into both nostrils daily., Disp: 16 g, Rfl: 6  EXAM:  Filed Vitals:   06/26/14 1547  BP: 92/60  Pulse: 86  Temp: 98.1 F (36.7 C)    Body mass index is 24.51 kg/(m^2).  GENERAL: vitals reviewed and listed above, alert, oriented, appears well hydrated and in no acute distress  HEENT: atraumatic, conjunttiva clear, no obvious abnormalities on inspection of  external nose and ears, normal appearance of ear canals and TMs, clear nasal congestion, boggy turbinates, mild post oropharyngeal erythema with PND, no tonsillar edema or exudate, no sinus TTP  NECK: no obvious masses on inspection  LUNGS: clear to auscultation bilaterally, no wheezes, rales or rhonchi, good air movement  CV: HRRR, no peripheral edema  MS: moves all extremities without noticeable abnormality  PSYCH: pleasant and cooperative, no obvious depression or anxiety  ASSESSMENT AND PLAN:  Discussed the following assessment and plan:  Allergic rhinitis, unspecified allergic rhinitis type - Plan: fluticasone (FLONASE) 50 MCG/ACT nasal spray  -antihistamine -flonase -allergy testing if not working -of course, we advised to return or notify a doctor immediately if symptoms worsen or persist or new concerns arise.    Patient Instructions  Flonase 2 sprays daily for 1 month, then 1 spray daily each nostril  Zyrtec 10mg  once daily before bed  If this is not working I would advise Allergy testing - please call the allergy office to schedule     Kriste BasqueKIM, HANNAH R.

## 2014-06-26 NOTE — Progress Notes (Signed)
Pre visit review using our clinic review tool, if applicable. No additional management support is needed unless otherwise documented below in the visit note. 

## 2014-06-28 ENCOUNTER — Telehealth: Payer: Self-pay | Admitting: Family Medicine

## 2014-06-28 DIAGNOSIS — L989 Disorder of the skin and subcutaneous tissue, unspecified: Secondary | ICD-10-CM

## 2014-06-28 NOTE — Telephone Encounter (Signed)
Pt needs referral to [facility] for insurance purposes. Pt states she has itching/ rough black spots. Can you place the referral?

## 2014-06-28 NOTE — Telephone Encounter (Signed)
Referral sent 

## 2014-06-28 NOTE — Telephone Encounter (Signed)
Patient informed. 

## 2015-06-12 ENCOUNTER — Ambulatory Visit (INDEPENDENT_AMBULATORY_CARE_PROVIDER_SITE_OTHER): Payer: BLUE CROSS/BLUE SHIELD | Admitting: Family Medicine

## 2015-06-12 ENCOUNTER — Encounter: Payer: Self-pay | Admitting: Family Medicine

## 2015-06-12 VITALS — BP 88/52 | HR 66 | Temp 98.0°F | Ht 58.34 in | Wt 112.3 lb

## 2015-06-12 DIAGNOSIS — J309 Allergic rhinitis, unspecified: Secondary | ICD-10-CM | POA: Insufficient documentation

## 2015-06-12 NOTE — Patient Instructions (Signed)
Zyrtec daily - please take in the evening if it makes you sleepy.  Flonase nasal spray 2 sprays each nostril daily for 1 month, then 1 spray each nostril daily.  Cool compresses and artificial tears for the eyes.  Follow up in 1 month if symptoms are not significantly better.

## 2015-06-12 NOTE — Progress Notes (Signed)
Pre visit review using our clinic review tool, if applicable. No additional management support is needed unless otherwise documented below in the visit note. 

## 2015-06-12 NOTE — Progress Notes (Signed)
  HPI:  [name] Boyer is a pleasant 40 yo here for an acute visit for:  Seasonal allergies: -reports suffers from allergies for several months every spring -symptoms: nasal congestion, sneezing, itchy watery eyes, PND -not currently using any treatment, tried allergy eye drops and these did not help  ROS: See pertinent positives and negatives per HPI.  Past Medical History  Diagnosis Date  . Left ankle sprain   . History of UTI   . IUFD (intrauterine fetal death) 06/02/2014    Past Surgical History  Procedure Laterality Date  . No past surgeries    . Dilation and evacuation N/A 06/02/2014    Procedure: DILATATION AND EVACUATION (D&E) 2ND TRIMESTER;  Surgeon: Sherian ReinJody Bovard-Stuckert, MD;  Location: [facility];  Service: Gynecology;  Laterality: N/A;    No family history on file.  Social History   Social History  . Marital Status: Married    Spouse Name: N/A  . Number of Children: N/A  . Years of Education: N/A   Social History Main Topics  . Smoking status: Never Smoker   . Smokeless tobacco: None  . Alcohol Use: No  . Drug Use: No  . Sexual Activity: Yes   Other Topics Concern  . None   Social History Narrative   Work or School: homemaker      Home Situation: lives with husband and 2 daughters (3 and 658 yo in 2015)      Spiritual Beliefs: Christian      Lifestyle: no regular exercise; lots of fruits and vegetables, meat and lots of white rice              Current outpatient prescriptions:  .  NON FORMULARY, Eye drops for allergies, Disp: , Rfl:  .  Prenatal Vit-Fe Fumarate-FA (PRENATAL MULTIVITAMIN) TABS tablet, Take 1 tablet by mouth daily., Disp: , Rfl:   EXAM:  Filed Vitals:   06/12/15 0809  BP: 88/52  Pulse: 66  Temp: 98 F (36.7 C)    Body mass index is 23.19 kg/(m^2).  GENERAL: vitals reviewed and listed above, alert, oriented, appears well hydrated and in no acute distress  HEENT: atraumatic, conjunttiva Mildly erythematous bilaterally with clear  discharge, no obvious abnormalities on inspection of external nose and ears, pale boggy turbinates with clear rhinorrhea, postnasal drip, mild cobblestoning of posterior oropharynx  NECK: no obvious masses on inspection  LUNGS: clear to auscultation bilaterally, no wheezes, rales or rhonchi, good air movement  CV: HRRR, no peripheral edema  MS: moves all extremities without noticeable abnormality  PSYCH: pleasant and cooperative, no obvious depression or anxiety  ASSESSMENT AND PLAN:  Discussed the following assessment and plan:  Allergic rhinitis, unspecified allergic rhinitis type  -Start INS and antihistamine -cool compresses and artificial tears -Follow up in 1 month -Patient advised to return or notify a doctor immediately if symptoms worsen or persist or new concerns arise.  Patient Instructions  Zyrtec daily - please take in the evening if it makes you sleepy.  Flonase nasal spray 2 sprays each nostril daily for 1 month, then 1 spray each nostril daily.  Cool compresses and artificial tears for the eyes.  Follow up in 1 month if symptoms are not significantly better.     Kriste BasqueKIM, HANNAH R.

## 2015-06-19 LAB — HM PAP SMEAR: HM Pap smear: 4182017

## 2015-10-05 LAB — OB RESULTS CONSOLE HIV ANTIBODY (ROUTINE TESTING): HIV: NONREACTIVE

## 2015-10-05 LAB — OB RESULTS CONSOLE RPR: RPR: NONREACTIVE

## 2015-10-05 LAB — OB RESULTS CONSOLE ABO/RH: RH Type: POSITIVE

## 2015-10-05 LAB — OB RESULTS CONSOLE ANTIBODY SCREEN: ANTIBODY SCREEN: NEGATIVE

## 2015-10-05 LAB — OB RESULTS CONSOLE HEPATITIS B SURFACE ANTIGEN: Hepatitis B Surface Ag: NEGATIVE

## 2015-10-05 LAB — OB RESULTS CONSOLE GC/CHLAMYDIA
CHLAMYDIA, DNA PROBE: NEGATIVE
GC PROBE AMP, GENITAL: NEGATIVE

## 2015-10-05 LAB — OB RESULTS CONSOLE RUBELLA ANTIBODY, IGM: Rubella: IMMUNE

## 2015-10-23 ENCOUNTER — Encounter: Payer: Self-pay | Admitting: Family Medicine

## 2015-10-28 ENCOUNTER — Inpatient Hospital Stay (HOSPITAL_COMMUNITY): Payer: BLUE CROSS/BLUE SHIELD

## 2015-10-28 ENCOUNTER — Encounter (HOSPITAL_COMMUNITY): Payer: Self-pay | Admitting: Certified Nurse Midwife

## 2015-10-28 ENCOUNTER — Inpatient Hospital Stay (HOSPITAL_COMMUNITY)
Admission: AD | Admit: 2015-10-28 | Discharge: 2015-10-28 | Disposition: A | Discharge status: Home / Self Care | Payer: BLUE CROSS/BLUE SHIELD | Source: Ambulatory Visit | Attending: Obstetrics and Gynecology | Admitting: Obstetrics and Gynecology

## 2015-10-28 DIAGNOSIS — Z3A13 13 weeks gestation of pregnancy: Secondary | ICD-10-CM | POA: Diagnosis not present

## 2015-10-28 DIAGNOSIS — O209 Hemorrhage in early pregnancy, unspecified: Secondary | ICD-10-CM | POA: Insufficient documentation

## 2015-10-28 DIAGNOSIS — O09291 Supervision of pregnancy with other poor reproductive or obstetric history, first trimester: Secondary | ICD-10-CM | POA: Insufficient documentation

## 2015-10-28 DIAGNOSIS — O09521 Supervision of elderly multigravida, first trimester: Secondary | ICD-10-CM | POA: Diagnosis present

## 2015-10-28 DIAGNOSIS — D509 Iron deficiency anemia, unspecified: Secondary | ICD-10-CM | POA: Insufficient documentation

## 2015-10-28 DIAGNOSIS — O09292 Supervision of pregnancy with other poor reproductive or obstetric history, second trimester: Secondary | ICD-10-CM

## 2015-10-28 DIAGNOSIS — O4692 Antepartum hemorrhage, unspecified, second trimester: Secondary | ICD-10-CM | POA: Diagnosis not present

## 2015-10-28 DIAGNOSIS — O4690 Antepartum hemorrhage, unspecified, unspecified trimester: Secondary | ICD-10-CM

## 2015-10-28 DIAGNOSIS — O9902 Anemia complicating childbirth: Secondary | ICD-10-CM | POA: Insufficient documentation

## 2015-10-28 DIAGNOSIS — O09522 Supervision of elderly multigravida, second trimester: Secondary | ICD-10-CM

## 2015-10-28 HISTORY — DX: Other specified health status: Z78.9

## 2015-10-28 LAB — URINALYSIS, ROUTINE W REFLEX MICROSCOPIC
Bilirubin Urine: NEGATIVE
Glucose, UA: NEGATIVE mg/dL
Ketones, ur: NEGATIVE mg/dL
LEUKOCYTES UA: NEGATIVE
NITRITE: NEGATIVE
PH: 5.5 (ref 5.0–8.0)
Protein, ur: NEGATIVE mg/dL
Specific Gravity, Urine: <1.005 — ABNORMAL LOW (ref 1.005–1.030)

## 2015-10-28 LAB — URINE MICROSCOPIC-ADD ON

## 2015-10-28 LAB — POCT PREGNANCY, URINE: Preg Test, Ur: POSITIVE — AB

## 2015-10-28 NOTE — Discharge Instructions (Signed)
Vaginal Bleeding During Pregnancy, Second Trimester A small amount of bleeding (spotting) from the vagina is relatively common in pregnancy. It usually stops on its own. Various things can cause bleeding or spotting in pregnancy. Some bleeding may be related to the pregnancy, and some may not. Sometimes the bleeding is normal and is not a problem. However, bleeding can also be a sign of something serious. Be sure to tell your health care provider about any vaginal bleeding right away. Some possible causes of vaginal bleeding during the second trimester include:  Infection, inflammation, or growths on the cervix.   The placenta may be partially or completely covering the opening of the cervix inside the uterus (placenta previa).  The placenta may have separated from the uterus (abruption of the placenta).   You may be having early (preterm) labor.   The cervix may not be strong enough to keep a baby inside the uterus (cervical insufficiency).   Tiny cysts may have developed in the uterus instead of pregnancy tissue (molar pregnancy). HOME CARE INSTRUCTIONS  Watch your condition for any changes. The following actions may help to lessen any discomfort you are feeling:  Follow your health care provider's instructions for limiting your activity. If your health care provider orders bed rest, you may need to stay in bed and only get up to use the bathroom. However, your health care provider may allow you to continue light activity.  If needed, make plans for someone to help with your regular activities and responsibilities while you are on bed rest.  Keep track of the number of pads you use each day, how often you change pads, and how soaked (saturated) they are. Write this down.  Do not use tampons. Do not douche.  Do not have sexual intercourse or orgasms until approved by your health care provider.  If you pass any tissue from your vagina, save the tissue so you can show it to your  health care provider.  Only take over-the-counter or prescription medicines as directed by your health care provider.  Do not take aspirin because it can make you bleed.  Do not exercise or perform any strenuous activities or heavy lifting without your health care provider's permission.  Keep all follow-up appointments as directed by your health care provider. SEEK MEDICAL CARE IF:  You have any vaginal bleeding during any part of your pregnancy.  You have cramps or labor pains.  You have a fever, not controlled by medicine. SEEK IMMEDIATE MEDICAL CARE IF:   You have severe cramps in your back or belly (abdomen).  You have contractions.  You have chills.  You pass large clots or tissue from your vagina.  Your bleeding increases.  You feel light-headed or weak, or you have fainting episodes.  You are leaking fluid or have a gush of fluid from your vagina. MAKE SURE YOU:  Understand these instructions.  Will watch your condition.  Will get help right away if you are not doing well or get worse.   This information is not intended to replace advice given to you by your health care provider. Make sure you discuss any questions you have with your health care provider.   Document Released: 11/27/2004 Document Revised: 02/22/2013 Document Reviewed: 10/25/2012 Elsevier Interactive Patient Education 2016 [employer] Inc.  

## 2015-10-28 NOTE — MAU Note (Signed)
Pt states she had bright red bleeding today at noon when she wiped. Pt had a small blood clot (pea sized). Pt denies cramping.

## 2015-10-28 NOTE — MAU Provider Note (Signed)
History     CSN: [id number]  Arrival date and time: 10/28/15 1236   First Provider Initiated Contact with Patient 10/28/15 1304      Chief Complaint  Patient presents with  . Vaginal Bleeding   [name] Boyer is a 40 y.o. [id number] at 5770w3d with onset first episode of vaginal bleeding about 1 hr PTA. No antecedent intercourse. She describes BRB on TP after voiding and thumbnail-size red clot in commode, streak on peri-pad. She is declining speculum and digital exam. Couple very concerned due to history repetitive pregnacy loss: NSVD x 2,  first tri SAB x3, followed by fetal demise dx at 18 wks with D&E performed April 2016.   Pregnancy course:  Complicated by poor Ob history, AMA, Fe-deficiency anemia with normal first tri US in office. States had blood tests for RPL with normal results.    Vaginal Bleeding  The patient's primary symptoms include vaginal bleeding. The patient's pertinent negatives include no genital itching, genital lesions, genital rash, pelvic pain or vaginal discharge. This is a new problem. The current episode started today. The problem occurs intermittently. The problem has been unchanged. The patient is experiencing no pain. She is pregnant. Pertinent negatives include no abdominal pain, back pain, diarrhea, dysuria, flank pain, hematuria or urgency. The vaginal bleeding is spotting. She has been passing clots. She has not been passing tissue. Nothing aggravates the symptoms. She has tried nothing for the symptoms. No, her partner does not have an STD.    OB History  Gravida Para Term Preterm AB Living  7 2 2  0 4 2  SAB TAB Ectopic Multiple Live Births  4 0 0 0 2    # Outcome Date GA Lbr Len/2nd Weight Sex Delivery Anes PTL Lv  7 Current           6 Term           5 Term           4 SAB           3 SAB           2 SAB           1 SAB                Past Medical History:  Diagnosis Date  . History of UTI   . IUFD (intrauterine fetal death) 06/02/2014  . Left  ankle sprain   . Medical history non-contributory     Past Surgical History:  Procedure Laterality Date  . DILATION AND EVACUATION N/A 06/02/2014   Procedure: DILATATION AND EVACUATION (D&E) 2ND TRIMESTER;  Surgeon: Sherian ReinJody Bovard-Stuckert, MD;  Location: [facility];  Service: Gynecology;  Laterality: N/A;  . NO PAST SURGERIES      History reviewed. No pertinent family history.  Social History  Substance Use Topics  . Smoking status: Never Smoker  . Smokeless tobacco: Never Used  . Alcohol use No    Allergies: No Known Allergies  Prescriptions Prior to Admission  Medication Sig Dispense Refill Last Dose  . ferrous sulfate 325 (65 FE) MG tablet Take 325 mg by mouth daily with breakfast.   10/28/2015 at Unknown time  . Prenatal Vit-Fe Fumarate-FA (PRENATAL MULTIVITAMIN) TABS tablet Take 1 tablet by mouth at bedtime.    10/27/2015 at Unknown time    Review of Systems  Gastrointestinal: Negative for abdominal pain and diarrhea.  Genitourinary: Positive for vaginal bleeding. Negative for dysuria, flank pain, hematuria, pelvic pain, urgency and vaginal  discharge.       Denies any irritativve vaginal discharge. Last intercourse 4 months ago  Musculoskeletal: Negative for back pain.   Physical Exam   Blood pressure 98/59, pulse 81, temperature 98 F (36.7 C), temperature source Oral, resp. rate 20, unknown if currently breastfeeding. DT FHR 156  Physical Exam  Nursing note and vitals reviewed. Constitutional: She is oriented to person, place, and time. She appears well-developed and well-nourished.  Eyes: Pupils are equal, round, and reactive to light.  Neck: Neck supple. No thyromegaly present.  Cardiovascular: Normal rate.   Respiratory: Effort normal.  GI: Soft.  Genitourinary:  Genitourinary Comments: Declined speculum exam VE after [place]: post, closed, long, scant dark blood  Musculoskeletal: Normal range of motion.  Neurological: She is alert and oriented to person, place, and  time.  Skin: Skin is warm.  Psychiatric:  anxious    MAU Course  Procedures Results for orders placed or performed during the hospital encounter of 10/28/15 (from the past 24 hour(s))  Urinalysis, Routine w reflex microscopic (not at [facility])     Status: Abnormal   Collection Time: 10/28/15 12:43 PM  Result Value Ref Range   Color, Urine YELLOW YELLOW   APPearance CLEAR CLEAR   Specific Gravity, Urine <1.005 (L) 1.005 - 1.030   pH 5.5 5.0 - 8.0   Glucose, UA NEGATIVE NEGATIVE mg/dL   Hgb urine dipstick LARGE (A) NEGATIVE   Bilirubin Urine NEGATIVE NEGATIVE   Ketones, ur NEGATIVE NEGATIVE mg/dL   Protein, ur NEGATIVE NEGATIVE mg/dL   Nitrite NEGATIVE NEGATIVE   Leukocytes, UA NEGATIVE NEGATIVE  Urine microscopic-add on     Status: Abnormal   Collection Time: 10/28/15 12:43 PM  Result Value Ref Range   Squamous Epithelial / LPF 0-5 (A) NONE SEEN   WBC, UA 0-5 0 - 5 WBC/hpf   RBC / HPF 6-30 0 - 5 RBC/hpf   Bacteria, UA FEW (A) NONE SEEN  Pregnancy, urine POC     Status: Abnormal   Collection Time: 10/28/15 12:48 PM  Result Value Ref Range   Preg Test, Ur POSITIVE (A) NEGATIVE  US Ob Comp Less 14 Wks  Result Date: 10/28/2015 CLINICAL DATA:  Vaginal bleeding in a first-trimester pregnancy. EXAM: OBSTETRIC <14 WK ULTRASOUND TECHNIQUE: Transabdominal ultrasound was performed for evaluation of the gestation as well as the maternal uterus and adnexal regions. COMPARISON:  Non for this pregnancy. FINDINGS: Intrauterine gestational sac: Present. Yolk sac:  Not seen. Embryo:  Present. Cardiac Activity: Present. Heart Rate: 154 bpm CRL:   7.5 cm  mm   13 w 4 d                  [place] EDC: 04/30/2016 Subchorionic hemorrhage:  None visualized. Maternal uterus/adnexae: Right ovary is not seen. Left ovary is normal. IMPRESSION: Single live intrauterine pregnancy at 13 weeks and 4 days gestation by crown-rump length measurement. No evidence of subchorionic hemorrhage. Normal appearance of the left ovary.  Right ovary was not visualized. Electronically Signed   By: Ted Mcalpine M.D.   On: 10/28/2015 13:55     C/W Dr. Mindi Slicker who agrees with POC  Assessment and Plan   1. Vaginal bleeding in pregnancy, second trimester   2. Vaginal bleeding in pregnancy   3. AMA (advanced maternal age) multigravida 35+, second trimester   4. Prior poor obstetrical history in second trimester, antepartum    Discharge home with reassurance, on pelvic rest and bleeding precautions   Medication List  TAKE these medications   ferrous sulfate 325 (65 FE) MG tablet Take 325 mg by mouth daily with breakfast.   prenatal multivitamin Tabs tablet Take 1 tablet by mouth at bedtime.      Follow-up Information    [facility], DO .   Specialty:  Obstetrics and Gynecology Why:  Keep your scheduled prenatal appointment  Contact information: [address] [place] [address] [place] [address] [phone]           [name] Mckissack 10/28/2015, 1:20 PM

## 2016-01-12 ENCOUNTER — Encounter (HOSPITAL_COMMUNITY): Payer: Self-pay | Admitting: *Deleted

## 2016-01-12 ENCOUNTER — Inpatient Hospital Stay (HOSPITAL_COMMUNITY)
Admission: AD | Admit: 2016-01-12 | Discharge: 2016-01-12 | Disposition: A | Discharge status: Home / Self Care | Payer: BLUE CROSS/BLUE SHIELD | Source: Ambulatory Visit | Attending: Obstetrics and Gynecology | Admitting: Obstetrics and Gynecology

## 2016-01-12 DIAGNOSIS — O368120 Decreased fetal movements, second trimester, not applicable or unspecified: Secondary | ICD-10-CM | POA: Insufficient documentation

## 2016-01-12 DIAGNOSIS — Z3A24 24 weeks gestation of pregnancy: Secondary | ICD-10-CM | POA: Insufficient documentation

## 2016-01-12 NOTE — Discharge Instructions (Signed)
Fetal Movement Counts  Patient Name: __________________________________________________ Patient Due Date: ____________________  Performing a fetal movement count is highly recommended in high-risk pregnancies, but it is good for every pregnant woman to do. Your health care provider may ask you to start counting fetal movements at 28 weeks of the pregnancy. Fetal movements often increase:  · After eating a full meal.  · After physical activity.  · After eating or drinking something sweet or cold.  · At rest.  Pay attention to when you feel the baby is most active. This will help you notice a pattern of your baby's sleep and wake cycles and what factors contribute to an increase in fetal movement. It is important to perform a fetal movement count at the same time each day when your baby is normally most active.   HOW TO COUNT FETAL MOVEMENTS  1. Find a quiet and comfortable area to sit or lie down on your left side. Lying on your left side provides the best blood and oxygen circulation to your baby.  2. Write down the day and time on a sheet of paper or in a journal.  3. Start counting kicks, flutters, swishes, rolls, or jabs in a 2-hour period. You should feel at least 10 movements within 2 hours.  4. If you do not feel 10 movements in 2 hours, wait 2-3 hours and count again. Look for a change in the pattern or not enough counts in 2 hours.  SEEK MEDICAL CARE IF:  · You feel less than 10 counts in 2 hours, tried twice.  · There is no movement in over an hour.  · The pattern is changing or taking longer each day to reach 10 counts in 2 hours.  · You feel the baby is not moving as he or she usually does.  Date: ____________ Movements: ____________ Start time: ____________ Finish time: ____________   Date: ____________ Movements: ____________ Start time: ____________ Finish time: ____________  Date: ____________ Movements: ____________ Start time: ____________ Finish time: ____________  Date: ____________ Movements:  ____________ Start time: ____________ Finish time: ____________  Date: ____________ Movements: ____________ Start time: ____________ Finish time: ____________  Date: ____________ Movements: ____________ Start time: ____________ Finish time: ____________  Date: ____________ Movements: ____________ Start time: ____________ Finish time: ____________  Date: ____________ Movements: ____________ Start time: ____________ Finish time: ____________   Date: ____________ Movements: ____________ Start time: ____________ Finish time: ____________  Date: ____________ Movements: ____________ Start time: ____________ Finish time: ____________  Date: ____________ Movements: ____________ Start time: ____________ Finish time: ____________  Date: ____________ Movements: ____________ Start time: ____________ Finish time: ____________  Date: ____________ Movements: ____________ Start time: ____________ Finish time: ____________  Date: ____________ Movements: ____________ Start time: ____________ Finish time: ____________  Date: ____________ Movements: ____________ Start time: ____________ Finish time: ____________   Date: ____________ Movements: ____________ Start time: ____________ Finish time: ____________  Date: ____________ Movements: ____________ Start time: ____________ Finish time: ____________  Date: ____________ Movements: ____________ Start time: ____________ Finish time: ____________  Date: ____________ Movements: ____________ Start time: ____________ Finish time: ____________  Date: ____________ Movements: ____________ Start time: ____________ Finish time: ____________  Date: ____________ Movements: ____________ Start time: ____________ Finish time: ____________  Date: ____________ Movements: ____________ Start time: ____________ Finish time: ____________   Date: ____________ Movements: ____________ Start time: ____________ Finish time: ____________  Date: ____________ Movements: ____________ Start time: ____________ Finish  time: ____________  Date: ____________ Movements: ____________ Start time: ____________ Finish time: ____________  Date: ____________ Movements: ____________ Start time:   ____________ Finish time: ____________  Date: ____________ Movements: ____________ Start time: ____________ Finish time: ____________  Date: ____________ Movements: ____________ Start time: ____________ Finish time: ____________  Date: ____________ Movements: ____________ Start time: ____________ Finish time: ____________   Date: ____________ Movements: ____________ Start time: ____________ Finish time: ____________  Date: ____________ Movements: ____________ Start time: ____________ Finish time: ____________  Date: ____________ Movements: ____________ Start time: ____________ Finish time: ____________  Date: ____________ Movements: ____________ Start time: ____________ Finish time: ____________  Date: ____________ Movements: ____________ Start time: ____________ Finish time: ____________  Date: ____________ Movements: ____________ Start time: ____________ Finish time: ____________  Date: ____________ Movements: ____________ Start time: ____________ Finish time: ____________   Date: ____________ Movements: ____________ Start time: ____________ Finish time: ____________  Date: ____________ Movements: ____________ Start time: ____________ Finish time: ____________  Date: ____________ Movements: ____________ Start time: ____________ Finish time: ____________  Date: ____________ Movements: ____________ Start time: ____________ Finish time: ____________  Date: ____________ Movements: ____________ Start time: ____________ Finish time: ____________  Date: ____________ Movements: ____________ Start time: ____________ Finish time: ____________  Date: ____________ Movements: ____________ Start time: ____________ Finish time: ____________   Date: ____________ Movements: ____________ Start time: ____________ Finish time: ____________  Date: ____________  Movements: ____________ Start time: ____________ Finish time: ____________  Date: ____________ Movements: ____________ Start time: ____________ Finish time: ____________  Date: ____________ Movements: ____________ Start time: ____________ Finish time: ____________  Date: ____________ Movements: ____________ Start time: ____________ Finish time: ____________  Date: ____________ Movements: ____________ Start time: ____________ Finish time: ____________  Date: ____________ Movements: ____________ Start time: ____________ Finish time: ____________   Date: ____________ Movements: ____________ Start time: ____________ Finish time: ____________  Date: ____________ Movements: ____________ Start time: ____________ Finish time: ____________  Date: ____________ Movements: ____________ Start time: ____________ Finish time: ____________  Date: ____________ Movements: ____________ Start time: ____________ Finish time: ____________  Date: ____________ Movements: ____________ Start time: ____________ Finish time: ____________  Date: ____________ Movements: ____________ Start time: ____________ Finish time: ____________     This information is not intended to replace advice given to you by your health care provider. Make sure you discuss any questions you have with your health care provider.     Document Released: 03/19/2006 Document Revised: 03/10/2014 Document Reviewed: 12/15/2011  Elsevier Interactive Patient Education ©2016 [employer]

## 2016-01-12 NOTE — MAU Provider Note (Signed)
History   [id number] @ 24.2 in with decreased fetal movement since last night. Denies vag bleeding or ROM.  CSN: [id number]  Arrival date & time 01/12/16  1532   None     Chief Complaint  Patient presents with  . Decreased Fetal Movement    HPI  Past Medical History:  Diagnosis Date  . History of UTI   . IUFD (intrauterine fetal death) 06/02/2014  . Left ankle sprain   . Medical history non-contributory     Past Surgical History:  Procedure Laterality Date  . DILATION AND EVACUATION N/A 06/02/2014   Procedure: DILATATION AND EVACUATION (D&E) 2ND TRIMESTER;  Surgeon: Sherian ReinJody Bovard-Stuckert, MD;  Location: [facility];  Service: Gynecology;  Laterality: N/A;  . NO PAST SURGERIES      No family history on file.  Social History  Substance Use Topics  . Smoking status: Never Smoker  . Smokeless tobacco: Never Used  . Alcohol use No    OB History    Gravida Para Term Preterm AB Living   7 2 2  0 4 2   SAB TAB Ectopic Multiple Live Births   4 0 0 0 2      Review of Systems  Constitutional: Negative.   HENT: Negative.   Eyes: Negative.   Respiratory: Negative.   Cardiovascular: Negative.   Gastrointestinal: Negative.   Endocrine: Negative.   Genitourinary: Negative.   Musculoskeletal: Negative.   Skin: Negative.   Allergic/Immunologic: Negative.   Neurological: Negative.   Hematological: Negative.   Psychiatric/Behavioral: Negative.     Allergies  Patient has no known allergies.  Home Medications    There were no vitals taken for this visit.  Physical Exam  Constitutional: She is oriented to person, place, and time. She appears well-developed and well-nourished.  HENT:  Head: Normocephalic.  Eyes: Pupils are equal, round, and reactive to light.  Neck: Normal range of motion.  Cardiovascular: Normal rate, regular rhythm, normal heart sounds and intact distal pulses.   Pulmonary/Chest: Effort normal and breath sounds normal.  Abdominal: Soft. Bowel sounds are  normal.  Musculoskeletal: Normal range of motion.  Neurological: She is alert and oriented to person, place, and time. She has normal reflexes.  Skin: Skin is warm and dry.  Psychiatric: She has a normal mood and affect. Her behavior is normal. Judgment and thought content normal.    MAU Course  Procedures (including critical care time)  Labs Reviewed - No data to display No results found.   No diagnosis found. Decreased fetal movement   MDM  FHR 140's strong and reg per us doppler and EFM. Good fetal movement noted on EFM pt is now feeling fetal movement at present time. POC discussed with Dr. Jackelyn KnifeMeisinger pt to be d/c home

## 2016-01-12 NOTE — MAU Note (Signed)
Pt stated had not felt baby move since last night. Denies any pain or vag bleeding

## 2016-03-27 LAB — OB RESULTS CONSOLE GBS: GBS: POSITIVE

## 2016-04-12 ENCOUNTER — Inpatient Hospital Stay (HOSPITAL_COMMUNITY)
Admission: AD | Admit: 2016-04-12 | Discharge: 2016-04-12 | Disposition: A | Discharge status: Home / Self Care | Payer: BLUE CROSS/BLUE SHIELD | Source: Ambulatory Visit | Attending: Obstetrics and Gynecology | Admitting: Obstetrics and Gynecology

## 2016-04-12 ENCOUNTER — Encounter (HOSPITAL_COMMUNITY): Payer: Self-pay | Admitting: *Deleted

## 2016-04-12 DIAGNOSIS — Z3A37 37 weeks gestation of pregnancy: Secondary | ICD-10-CM | POA: Diagnosis not present

## 2016-04-12 DIAGNOSIS — Z3483 Encounter for supervision of other normal pregnancy, third trimester: Secondary | ICD-10-CM | POA: Insufficient documentation

## 2016-04-12 NOTE — Progress Notes (Signed)
Dr Ellyn HackBovard notified of pt arrival con presenting complaint, labor eval.

## 2016-04-12 NOTE — Progress Notes (Signed)
Dr Ellyn HackBovard called to check on patient. Pt may d/c home if not feeling contractions and FHT is reactive and reassuring

## 2016-04-17 ENCOUNTER — Telehealth (HOSPITAL_COMMUNITY): Payer: Self-pay | Admitting: *Deleted

## 2016-04-17 ENCOUNTER — Encounter (HOSPITAL_COMMUNITY): Payer: Self-pay | Admitting: *Deleted

## 2016-04-17 NOTE — Telephone Encounter (Signed)
Preadmission screen  

## 2016-04-24 ENCOUNTER — Inpatient Hospital Stay (HOSPITAL_COMMUNITY)
Admission: AD | Admit: 2016-04-24 | Discharge: 2016-04-28 | DRG: 765 | Disposition: A | Discharge status: Home / Self Care | Payer: BLUE CROSS/BLUE SHIELD | Source: Ambulatory Visit | Attending: Obstetrics and Gynecology | Admitting: Obstetrics and Gynecology

## 2016-04-24 DIAGNOSIS — O321XX0 Maternal care for breech presentation, not applicable or unspecified: Principal | ICD-10-CM | POA: Diagnosis present

## 2016-04-24 DIAGNOSIS — Z98891 History of uterine scar from previous surgery: Secondary | ICD-10-CM

## 2016-04-24 DIAGNOSIS — O9081 Anemia of the puerperium: Secondary | ICD-10-CM | POA: Diagnosis not present

## 2016-04-24 DIAGNOSIS — R51 Headache: Secondary | ICD-10-CM | POA: Diagnosis not present

## 2016-04-24 DIAGNOSIS — O322XX0 Maternal care for transverse and oblique lie, not applicable or unspecified: Secondary | ICD-10-CM

## 2016-04-24 DIAGNOSIS — Z3A39 39 weeks gestation of pregnancy: Secondary | ICD-10-CM

## 2016-04-24 DIAGNOSIS — D62 Acute posthemorrhagic anemia: Secondary | ICD-10-CM | POA: Diagnosis not present

## 2016-04-24 DIAGNOSIS — O99824 Streptococcus B carrier state complicating childbirth: Secondary | ICD-10-CM | POA: Diagnosis present

## 2016-04-24 NOTE — MAU Provider Note (Signed)
Lucretia FieldZila Neisler is a 41 y.o. [id number] at [id number] who presents today for a labor check. RN reports that cervical exam is difficult, and unable to palpate much in the pelvis.  Bedside US: transverse with fetal head to the right and back down.  RN called report to the on call MD. Will watch patient for one more hour.  Tawnya CrookHogan, Reinhard Schack Donovan  11:52 PM 04/24/16

## 2016-04-25 ENCOUNTER — Inpatient Hospital Stay (HOSPITAL_COMMUNITY): Payer: BLUE CROSS/BLUE SHIELD | Admitting: Anesthesiology

## 2016-04-25 ENCOUNTER — Inpatient Hospital Stay (HOSPITAL_COMMUNITY): Payer: BLUE CROSS/BLUE SHIELD

## 2016-04-25 ENCOUNTER — Encounter (HOSPITAL_COMMUNITY)
Admission: AD | Disposition: A | Discharge status: Home / Self Care | Payer: Self-pay | Source: Ambulatory Visit | Attending: Obstetrics and Gynecology

## 2016-04-25 ENCOUNTER — Encounter (HOSPITAL_COMMUNITY): Payer: Self-pay

## 2016-04-25 DIAGNOSIS — O322XX0 Maternal care for transverse and oblique lie, not applicable or unspecified: Secondary | ICD-10-CM

## 2016-04-25 DIAGNOSIS — Z3A39 39 weeks gestation of pregnancy: Secondary | ICD-10-CM | POA: Diagnosis not present

## 2016-04-25 DIAGNOSIS — O99824 Streptococcus B carrier state complicating childbirth: Secondary | ICD-10-CM | POA: Diagnosis present

## 2016-04-25 DIAGNOSIS — Z98891 History of uterine scar from previous surgery: Secondary | ICD-10-CM

## 2016-04-25 DIAGNOSIS — D62 Acute posthemorrhagic anemia: Secondary | ICD-10-CM | POA: Diagnosis not present

## 2016-04-25 DIAGNOSIS — O321XX0 Maternal care for breech presentation, not applicable or unspecified: Secondary | ICD-10-CM | POA: Diagnosis present

## 2016-04-25 DIAGNOSIS — R51 Headache: Secondary | ICD-10-CM | POA: Diagnosis not present

## 2016-04-25 DIAGNOSIS — Z3493 Encounter for supervision of normal pregnancy, unspecified, third trimester: Secondary | ICD-10-CM | POA: Diagnosis present

## 2016-04-25 DIAGNOSIS — O9081 Anemia of the puerperium: Secondary | ICD-10-CM | POA: Diagnosis not present

## 2016-04-25 LAB — CBC
HCT: 35 % — ABNORMAL LOW (ref 36.0–46.0)
Hemoglobin: 11.9 g/dL — ABNORMAL LOW (ref 12.0–15.0)
MCH: 22.2 pg — AB (ref 26.0–34.0)
MCHC: 34 g/dL (ref 30.0–36.0)
MCV: 65.2 fL — AB (ref 78.0–100.0)
PLATELETS: 252 10*3/uL (ref 150–400)
RBC: 5.37 MIL/uL — AB (ref 3.87–5.11)
RDW: 15.3 % (ref 11.5–15.5)
WBC: 11.3 10*3/uL — ABNORMAL HIGH (ref 4.0–10.5)

## 2016-04-25 SURGERY — Surgical Case
Anesthesia: Monitor Anesthesia Care

## 2016-04-25 MED ORDER — KETOROLAC TROMETHAMINE 30 MG/ML IJ SOLN
INTRAMUSCULAR | Status: AC
  Administered 2016-04-25: 30 mg
  Filled 2016-04-25: qty 1

## 2016-04-25 MED ORDER — COCONUT OIL OIL: 1.0000 "application " | TOPICAL_OIL | Status: DC | PRN

## 2016-04-25 MED ORDER — KETOROLAC TROMETHAMINE 30 MG/ML IJ SOLN: 30.0000 mg | Freq: Four times a day (QID) | INTRAMUSCULAR | Status: DC | PRN

## 2016-04-25 MED ORDER — DEXAMETHASONE SODIUM PHOSPHATE 10 MG/ML IJ SOLN
INTRAMUSCULAR | Status: AC
  Filled 2016-04-25: qty 1

## 2016-04-25 MED ORDER — FAMOTIDINE IN NACL 20-0.9 MG/50ML-% IV SOLN
20.0000 mg | Freq: Once | INTRAVENOUS | Status: AC
  Administered 2016-04-25: 20 mg via INTRAVENOUS
  Filled 2016-04-25: qty 50

## 2016-04-25 MED ORDER — PROMETHAZINE HCL 25 MG/ML IJ SOLN: 6.2500 mg | INTRAMUSCULAR | Status: DC | PRN

## 2016-04-25 MED ORDER — PRENATAL MULTIVITAMIN CH
1.0000 | ORAL_TABLET | Freq: Every day | ORAL | Status: DC
  Administered 2016-04-25 – 2016-04-28 (×4): 1 via ORAL
  Filled 2016-04-25 (×4): qty 1

## 2016-04-25 MED ORDER — SCOPOLAMINE 1 MG/3DAYS TD PT72: 1.0000 | MEDICATED_PATCH | Freq: Once | TRANSDERMAL | Status: DC

## 2016-04-25 MED ORDER — DIPHENHYDRAMINE HCL 25 MG PO CAPS: 25.0000 mg | ORAL_CAPSULE | ORAL | Status: DC | PRN

## 2016-04-25 MED ORDER — DIPHENHYDRAMINE HCL 50 MG/ML IJ SOLN: 12.5000 mg | INTRAMUSCULAR | Status: DC | PRN

## 2016-04-25 MED ORDER — MORPHINE SULFATE (PF) 0.5 MG/ML IJ SOLN
INTRAMUSCULAR | Status: AC
  Filled 2016-04-25: qty 10

## 2016-04-25 MED ORDER — SIMETHICONE 80 MG PO CHEW
80.0000 mg | CHEWABLE_TABLET | ORAL | Status: DC
  Administered 2016-04-25 – 2016-04-27 (×3): 80 mg via ORAL
  Filled 2016-04-25 (×3): qty 1

## 2016-04-25 MED ORDER — ONDANSETRON HCL 4 MG/2ML IJ SOLN
INTRAMUSCULAR | Status: AC
  Filled 2016-04-25: qty 2

## 2016-04-25 MED ORDER — TETANUS-DIPHTH-ACELL PERTUSSIS 5-2.5-18.5 LF-MCG/0.5 IM SUSP
0.5000 mL | Freq: Once | INTRAMUSCULAR | Status: DC
Start: 2016-04-26 — End: 2016-04-28

## 2016-04-25 MED ORDER — DEXTROSE 5 % IV SOLN
1.0000 ug/kg/h | INTRAVENOUS | Status: DC | PRN
  Filled 2016-04-25: qty 2

## 2016-04-25 MED ORDER — NALBUPHINE HCL 10 MG/ML IJ SOLN: 5.0000 mg | Freq: Once | INTRAMUSCULAR | Status: DC | PRN

## 2016-04-25 MED ORDER — FENTANYL CITRATE (PF) 100 MCG/2ML IJ SOLN
INTRAMUSCULAR | Status: AC
  Filled 2016-04-25: qty 2

## 2016-04-25 MED ORDER — MEPERIDINE HCL 25 MG/ML IJ SOLN
INTRAMUSCULAR | Status: DC | PRN
  Administered 2016-04-25 (×2): 12.5 mg via INTRAVENOUS

## 2016-04-25 MED ORDER — IBUPROFEN 600 MG PO TABS: 600.0000 mg | ORAL_TABLET | Freq: Four times a day (QID) | ORAL | Status: DC

## 2016-04-25 MED ORDER — LACTATED RINGERS IV SOLN
INTRAVENOUS | Status: DC
  Administered 2016-04-25 (×2): via INTRAVENOUS

## 2016-04-25 MED ORDER — ACETAMINOPHEN 325 MG PO TABS
650.0000 mg | ORAL_TABLET | ORAL | Status: DC | PRN
  Administered 2016-04-25 – 2016-04-28 (×5): 650 mg via ORAL
  Filled 2016-04-25 (×5): qty 2

## 2016-04-25 MED ORDER — FENTANYL CITRATE (PF) 100 MCG/2ML IJ SOLN
INTRAMUSCULAR | Status: DC | PRN
  Administered 2016-04-25: 10 ug via INTRATHECAL

## 2016-04-25 MED ORDER — LACTATED RINGERS IV SOLN
INTRAVENOUS | Status: DC | PRN
  Administered 2016-04-25: 03:00:00 via INTRAVENOUS

## 2016-04-25 MED ORDER — MEPERIDINE HCL 25 MG/ML IJ SOLN: INTRAMUSCULAR | Status: DC | PRN

## 2016-04-25 MED ORDER — SIMETHICONE 80 MG PO CHEW: 80.0000 mg | CHEWABLE_TABLET | ORAL | Status: DC | PRN

## 2016-04-25 MED ORDER — HYDROMORPHONE HCL 1 MG/ML IJ SOLN
INTRAMUSCULAR | Status: AC
  Filled 2016-04-25: qty 1

## 2016-04-25 MED ORDER — MORPHINE SULFATE-NACL 0.5-0.9 MG/ML-% IV SOSY
PREFILLED_SYRINGE | INTRAVENOUS | Status: DC | PRN
  Administered 2016-04-25: .2 mg via INTRATHECAL

## 2016-04-25 MED ORDER — ONDANSETRON HCL 4 MG/2ML IJ SOLN
INTRAMUSCULAR | Status: DC | PRN
  Administered 2016-04-25: 4 mg via INTRAVENOUS

## 2016-04-25 MED ORDER — DIPHENHYDRAMINE HCL 25 MG PO CAPS: 25.0000 mg | ORAL_CAPSULE | Freq: Four times a day (QID) | ORAL | Status: DC | PRN

## 2016-04-25 MED ORDER — CEFAZOLIN SODIUM-DEXTROSE 2-4 GM/100ML-% IV SOLN
2.0000 g | Freq: Once | INTRAVENOUS | Status: AC
  Administered 2016-04-25: 2 g via INTRAVENOUS
  Filled 2016-04-25: qty 100

## 2016-04-25 MED ORDER — SCOPOLAMINE 1 MG/3DAYS TD PT72
MEDICATED_PATCH | TRANSDERMAL | Status: DC | PRN
  Administered 2016-04-25: 1 via TRANSDERMAL

## 2016-04-25 MED ORDER — DIBUCAINE 1 % RE OINT: 1.0000 "application " | TOPICAL_OINTMENT | RECTAL | Status: DC | PRN

## 2016-04-25 MED ORDER — NALBUPHINE HCL 10 MG/ML IJ SOLN: 5.0000 mg | INTRAMUSCULAR | Status: DC | PRN

## 2016-04-25 MED ORDER — OXYCODONE HCL 5 MG PO TABS
10.0000 mg | ORAL_TABLET | ORAL | Status: DC | PRN
  Administered 2016-04-25 – 2016-04-27 (×8): 10 mg via ORAL
  Filled 2016-04-25 (×8): qty 2

## 2016-04-25 MED ORDER — OXYCODONE HCL 5 MG PO TABS
5.0000 mg | ORAL_TABLET | ORAL | Status: DC | PRN
  Administered 2016-04-25 – 2016-04-28 (×3): 5 mg via ORAL
  Filled 2016-04-25 (×3): qty 1

## 2016-04-25 MED ORDER — LACTATED RINGERS IV BOLUS (SEPSIS)
1000.0000 mL | Freq: Once | INTRAVENOUS | Status: AC
  Administered 2016-04-25: 1000 mL via INTRAVENOUS

## 2016-04-25 MED ORDER — PHENYLEPHRINE 8 MG IN D5W 100 ML (0.08MG/ML) PREMIX OPTIME
INJECTION | INTRAVENOUS | Status: DC | PRN
  Administered 2016-04-25: 45 ug/min via INTRAVENOUS

## 2016-04-25 MED ORDER — BUPIVACAINE IN DEXTROSE 0.75-8.25 % IT SOLN
INTRATHECAL | Status: DC | PRN
  Administered 2016-04-25: 12 mg via INTRATHECAL

## 2016-04-25 MED ORDER — SIMETHICONE 80 MG PO CHEW
80.0000 mg | CHEWABLE_TABLET | Freq: Three times a day (TID) | ORAL | Status: DC
  Administered 2016-04-25 – 2016-04-28 (×8): 80 mg via ORAL
  Filled 2016-04-25 (×9): qty 1

## 2016-04-25 MED ORDER — DEXAMETHASONE SODIUM PHOSPHATE 10 MG/ML IJ SOLN
INTRAMUSCULAR | Status: DC | PRN
  Administered 2016-04-25: 10 mg via INTRAVENOUS

## 2016-04-25 MED ORDER — LACTATED RINGERS IV SOLN
INTRAVENOUS | Status: DC | PRN
  Administered 2016-04-25: 40 [IU] via INTRAVENOUS

## 2016-04-25 MED ORDER — HYDROMORPHONE HCL 1 MG/ML IJ SOLN
0.2500 mg | INTRAMUSCULAR | Status: DC | PRN
  Administered 2016-04-25: 0.5 mg via INTRAVENOUS

## 2016-04-25 MED ORDER — MENTHOL 3 MG MT LOZG: 1.0000 | LOZENGE | OROMUCOSAL | Status: DC | PRN

## 2016-04-25 MED ORDER — ZOLPIDEM TARTRATE 5 MG PO TABS: 5.0000 mg | ORAL_TABLET | Freq: Every evening | ORAL | Status: DC | PRN

## 2016-04-25 MED ORDER — PHENYLEPHRINE 8 MG IN D5W 100 ML (0.08MG/ML) PREMIX OPTIME
INJECTION | INTRAVENOUS | Status: AC
  Filled 2016-04-25: qty 100

## 2016-04-25 MED ORDER — OXYTOCIN 10 UNIT/ML IJ SOLN
INTRAMUSCULAR | Status: AC
  Filled 2016-04-25: qty 4

## 2016-04-25 MED ORDER — SOD CITRATE-CITRIC ACID 500-334 MG/5ML PO SOLN
30.0000 mL | Freq: Once | ORAL | Status: AC
  Administered 2016-04-25: 30 mL via ORAL
  Filled 2016-04-25: qty 15

## 2016-04-25 MED ORDER — ONDANSETRON HCL 4 MG/2ML IJ SOLN: 4.0000 mg | Freq: Three times a day (TID) | INTRAMUSCULAR | Status: DC | PRN

## 2016-04-25 MED ORDER — MEPERIDINE HCL 25 MG/ML IJ SOLN
INTRAMUSCULAR | Status: AC
  Filled 2016-04-25: qty 1

## 2016-04-25 MED ORDER — OXYTOCIN 40 UNITS IN LACTATED RINGERS INFUSION - SIMPLE MED: 2.5000 [IU]/h | INTRAVENOUS | Status: AC

## 2016-04-25 MED ORDER — MEPERIDINE HCL 25 MG/ML IJ SOLN: 6.2500 mg | INTRAMUSCULAR | Status: DC | PRN

## 2016-04-25 MED ORDER — LACTATED RINGERS IV SOLN
INTRAVENOUS | Status: DC | PRN
  Administered 2016-04-25: 04:00:00 via INTRAVENOUS

## 2016-04-25 MED ORDER — WITCH HAZEL-GLYCERIN EX PADS: 1.0000 "application " | MEDICATED_PAD | CUTANEOUS | Status: DC | PRN

## 2016-04-25 MED ORDER — LACTATED RINGERS IV SOLN
INTRAVENOUS | Status: DC
  Administered 2016-04-25: 15:00:00 via INTRAVENOUS

## 2016-04-25 MED ORDER — CEFAZOLIN SODIUM-DEXTROSE 2-4 GM/100ML-% IV SOLN: 2.0000 g | Freq: Once | INTRAVENOUS | Status: DC

## 2016-04-25 MED ORDER — BUTORPHANOL TARTRATE 1 MG/ML IJ SOLN
1.0000 mg | INTRAMUSCULAR | Status: DC | PRN
  Administered 2016-04-25: 1 mg via INTRAVENOUS
  Filled 2016-04-25: qty 1

## 2016-04-25 MED ORDER — IBUPROFEN 600 MG PO TABS
600.0000 mg | ORAL_TABLET | Freq: Four times a day (QID) | ORAL | Status: DC
  Administered 2016-04-25 – 2016-04-28 (×13): 600 mg via ORAL
  Filled 2016-04-25 (×13): qty 1

## 2016-04-25 MED ORDER — SENNOSIDES-DOCUSATE SODIUM 8.6-50 MG PO TABS
2.0000 | ORAL_TABLET | ORAL | Status: DC
  Administered 2016-04-25 – 2016-04-27 (×3): 2 via ORAL
  Filled 2016-04-25 (×3): qty 2

## 2016-04-25 MED ORDER — SODIUM CHLORIDE 0.9% FLUSH
3.0000 mL | INTRAVENOUS | Status: DC | PRN
Start: 2016-04-25 — End: 2016-04-25

## 2016-04-25 MED ORDER — NALOXONE HCL 0.4 MG/ML IJ SOLN: 0.4000 mg | INTRAMUSCULAR | Status: DC | PRN

## 2016-04-25 SURGICAL SUPPLY — 36 items
BENZOIN TINCTURE PRP APPL 2/3 (GAUZE/BANDAGES/DRESSINGS) ×3 IMPLANT
CHLORAPREP W/TINT 26ML (MISCELLANEOUS) ×3 IMPLANT
CLAMP CORD UMBIL (MISCELLANEOUS) IMPLANT
CLOSURE WOUND 1/2 X4 (GAUZE/BANDAGES/DRESSINGS) ×1
CLOTH BEACON ORANGE TIMEOUT ST (SAFETY) ×3 IMPLANT
DRAPE C SECTION CLR SCREEN (DRAPES) ×3 IMPLANT
DRSG OPSITE POSTOP 4X10 (GAUZE/BANDAGES/DRESSINGS) ×3 IMPLANT
ELECT REM PT RETURN 9FT ADLT (ELECTROSURGICAL) ×3
ELECTRODE REM PT RTRN 9FT ADLT (ELECTROSURGICAL) ×1 IMPLANT
EXTRACTOR VACUUM KIWI (MISCELLANEOUS) IMPLANT
GLOVE BIO SURGEON STRL SZ 6.5 (GLOVE) ×2 IMPLANT
GLOVE BIO SURGEONS STRL SZ 6.5 (GLOVE) ×1
GLOVE BIOGEL PI IND STRL 7.0 (GLOVE) ×1 IMPLANT
GLOVE BIOGEL PI INDICATOR 7.0 (GLOVE) ×2
GOWN STRL REUS W/TWL LRG LVL3 (GOWN DISPOSABLE) ×6 IMPLANT
KIT ABG SYR 3ML LUER SLIP (SYRINGE) IMPLANT
NEEDLE HYPO 25X5/8 SAFETYGLIDE (NEEDLE) IMPLANT
NS IRRIG 1000ML POUR BTL (IV SOLUTION) ×3 IMPLANT
PACK C SECTION WH (CUSTOM PROCEDURE TRAY) ×3 IMPLANT
PAD ABD DERMACEA PRESS 5X9 (GAUZE/BANDAGES/DRESSINGS) ×3 IMPLANT
PAD OB MATERNITY 4.3X12.25 (PERSONAL CARE ITEMS) ×3 IMPLANT
PENCIL SMOKE EVAC W/HOLSTER (ELECTROSURGICAL) ×3 IMPLANT
RTRCTR C-SECT PINK 25CM LRG (MISCELLANEOUS) ×3 IMPLANT
STRIP CLOSURE SKIN 1/2X4 (GAUZE/BANDAGES/DRESSINGS) ×2 IMPLANT
SUT CHROMIC 1 CTX 36 (SUTURE) ×6 IMPLANT
SUT PLAIN 0 NONE (SUTURE) IMPLANT
SUT PLAIN 2 0 XLH (SUTURE) ×3 IMPLANT
SUT VIC AB 0 CT1 27 (SUTURE) ×4
SUT VIC AB 0 CT1 27XBRD ANBCTR (SUTURE) ×2 IMPLANT
SUT VIC AB 2-0 CT1 27 (SUTURE) ×2
SUT VIC AB 2-0 CT1 TAPERPNT 27 (SUTURE) ×1 IMPLANT
SUT VIC AB 3-0 CT1 27 (SUTURE)
SUT VIC AB 3-0 CT1 TAPERPNT 27 (SUTURE) IMPLANT
SUT VIC AB 4-0 KS 27 (SUTURE) ×3 IMPLANT
TOWEL OR 17X24 6PK STRL BLUE (TOWEL DISPOSABLE) ×3 IMPLANT
TRAY FOLEY CATH SILVER 14FR (SET/KITS/TRAYS/PACK) ×3 IMPLANT

## 2016-04-25 NOTE — Anesthesia Procedure Notes (Signed)
Spinal  Patient location during procedure: OR Start time: 04/25/2016 3:29 AM End time: 04/25/2016 3:39 AM Staffing Anesthesiologist: Heather RobertsSINGER, Rossie Scarfone Performed: anesthesiologist  Preanesthetic Checklist Completed: patient identified, surgical consent, pre-op evaluation, timeout performed, IV checked, risks and benefits discussed and monitors and equipment checked Spinal Block Patient position: sitting Prep: DuraPrep Patient monitoring: cardiac monitor, continuous pulse ox and blood pressure Approach: midline Location: L2-3 Injection technique: single-shot Needle Needle type: Pencan  Needle gauge: 24 G Needle length: 9 cm Additional Notes Functioning IV was confirmed and monitors were applied. Sterile prep and drape, including hand hygiene and sterile gloves were used. The patient was positioned and the spine was prepped. The skin was anesthetized with lidocaine.  Free flow of clear CSF was obtained prior to injecting local anesthetic into the CSF.  The spinal needle aspirated freely following injection.  The needle was carefully withdrawn.  The patient tolerated the procedure well.

## 2016-04-25 NOTE — H&P (Signed)
[name] Boyer is a 41 y.o. female presenting for painful contractions. Pt reports intermittent contractions for the past few weeks however beginning at 1900 last night they begun to get more consistent. Pt had not made cervical change on arrival at MAU and despite regular contractions cervix remained unchanged. Pt was however noted to have a baby in a transverse lie; confirmed via US.  Pt was offered a version vs primary cesarean delivery and risks/benefits reveiwed. After conferring with family pt opted for a primary c/s.   Of note,  her dating is by a 7week US. She declined first trimester genetic screening  blood work but NT on US noted as normal. She also declined flu shot but did get a Tdap vaccination. No other complications in pregnancy.  GBS pos OB History    Gravida Para Term Preterm AB Living   7 2 2  0 4 2   SAB TAB Ectopic Multiple Live Births   4 0 0 0 2     Past Medical History:  Diagnosis Date  . History of UTI   . IUFD (intrauterine fetal death) 06/02/2014  . Left ankle sprain   . Medical history non-contributory    Past Surgical History:  Procedure Laterality Date  . DILATION AND EVACUATION N/A 06/02/2014   Procedure: DILATATION AND EVACUATION (D&E) 2ND TRIMESTER;  Surgeon: Sherian ReinJody Bovard-Stuckert, MD;  Location: [facility];  Service: Gynecology;  Laterality: N/A;  . NO PAST SURGERIES     Family History: family history is not on file. Social History:  reports that she has never smoked. She has never used smokeless tobacco. She reports that she does not drink alcohol or use drugs.     Maternal Diabetes: No Genetic Screening: Declined Maternal Ultrasounds/Referrals: Normal Fetal Ultrasounds or other Referrals:  None Maternal Substance Abuse:  No Significant Maternal Medications:  None Significant Maternal Lab Results:  Lab values include: Group B Strep positive Other Comments:  None  Review of Systems  Constitutional: Negative for chills, fever, malaise/fatigue and weight loss.   HENT: Negative for tinnitus.   Eyes: Negative for blurred vision.  Respiratory: Negative for shortness of breath.   Cardiovascular: Negative for chest pain and palpitations.  Gastrointestinal: Positive for abdominal pain. Negative for heartburn, nausea and vomiting.  Genitourinary: Negative for dysuria.  Musculoskeletal: Positive for back pain.  Skin: Negative for rash.  Neurological: Negative for dizziness and headaches.  Endo/Heme/Allergies: Does not bruise/bleed easily.  Psychiatric/Behavioral: Negative for depression, hallucinations, substance abuse and suicidal ideas. The patient is not nervous/anxious.    Maternal Medical History:  Reason for admission: Contractions.  Nausea.  Contractions: Onset was 3-5 hours ago.   Frequency: regular.   Perceived severity is moderate.    Fetal activity: Perceived fetal activity is normal.   Last perceived fetal movement was within the past hour.    Prenatal complications: no prenatal complications Prenatal Complications - Diabetes: none.    Dilation: 2.5 Effacement (%): 60 Station: -3 Exam by:: Kayren EavesAshley Garvey RN  Blood pressure 118/59, pulse 62, temperature 97.5 F (36.4 C), temperature source Oral, resp. rate 17, height 5' (1.524 m), weight 155 lb (70.3 kg), unknown if currently breastfeeding. Maternal Exam:  Uterine Assessment: Contraction strength is moderate.  Contraction frequency is regular.   Abdomen: Patient reports generalized tenderness.  Estimated fetal weight is AGA.   Fetal presentation: breech  Introitus: Normal vulva. Vulva is negative for condylomata and lesion.  Normal vagina.  Vagina is negative for condylomata and discharge.  Pelvis: of concern for  delivery.   Cervix: Cervix evaluated by digital exam.     Physical Exam  Constitutional: She is oriented to person, place, and time. She appears well-developed and well-nourished.  Neck: Normal range of motion.  Cardiovascular: Normal rate.   Respiratory:  Effort normal.  GI: Soft. There is generalized tenderness. There is no guarding.  Genitourinary: Vagina normal and uterus normal. Vulva exhibits no lesion. No vaginal discharge found.  Musculoskeletal: Normal range of motion. She exhibits no edema.  Neurological: She is alert and oriented to person, place, and time.  Skin: Skin is warm.  Psychiatric: She has a normal mood and affect. Her behavior is normal. Judgment and thought content normal.    Prenatal labs: ABO, Rh: --/--/B POS (02/23 0115) Antibody: NEG (02/23 0115) Rubella: Immune (08/04 0000) RPR: Nonreactive (08/04 0000)  HBsAg: Negative (08/04 0000)  HIV: Non-reactive (08/04 0000)  GBS: Positive (01/25 0000)   Assessment/Plan: [id number] at 14 1/[redacted]wks gestation with baby in transverse lie, in latent labor opting for primary cesarean delivery R/B/A reviewed and all questions answered Consent signed after procedure reviewed TO OR when ready  Sharol Given [name] 04/25/2016, 2:29 AM

## 2016-04-25 NOTE — Lactation Note (Signed)
This note was copied from a baby's chart. Lactation Consultation Note  P3, baby 10 hours old.  Breastfed first child for 5 years.  Did not breastfeed second child.  Mother states she wants to breastfeed and formula feed. Baby latched on R side upon entering. Sucks and swallows observed. Discussed waking techniques, cluster feeding.  Mom encouraged to feed baby 8-12 times/24 hours and with feeding cues.  Encouraged mother to breastfeed before offering formula. Provided family with volume guidelines. Mom made aware of O/P services, breastfeeding support groups, community resources, and our phone # for post-discharge questions.     Patient Name: [name] Tory EmeraldZila Vanhise ZOXWR'UToday's Date: Boyer Reason for consult: Initial assessment   Maternal Data    Feeding Feeding Type: Breast Fed Nipple Type: Slow - flow  LATCH Score/Interventions Latch: Grasps breast easily, tongue down, lips flanged, rhythmical sucking. (latched upon entering)  Audible Swallowing: A few with stimulation Intervention(s): Alternate breast massage  Type of Nipple: Everted at rest and after stimulation  Comfort (Breast/Nipple): Soft / non-tender     Hold (Positioning): Assistance needed to correctly position infant at breast and maintain latch.  LATCH Score: 8  Lactation Tools Discussed/Used     Consult Status Consult Status: Follow-up Date: 04/26/16 Follow-up type: In-patient    Dahlia ByesBerkelhammer, Try[place] Bodi [facility] HospitalBoschen Boyer, 2:29 PM

## 2016-04-25 NOTE — MAU Note (Signed)
Notified provider that patient is here for a labor eval. Patient's cervix has no changed after an hour but after bedside ultrasound from Thressa ShellerHeather Hogan CNM transverse presentation noted. Provider said to recheck patient in an hour then call her back.

## 2016-04-25 NOTE — Transfer of Care (Signed)
Immediate Anesthesia Transfer of Care Note  Patient: [name] Boyer  Procedure(s) Performed: Procedure(s): CESAREAN SECTION (N/A)  Patient Location: PACU  Anesthesia Type:Spinal  Level of Consciousness: awake, alert , oriented and patient cooperative  Airway & Oxygen Therapy: Patient Spontanous Breathing  Post-op Assessment: Report given to RN and Post -op Vital signs reviewed and stable  Post vital signs: Reviewed and stable  Last Vitals:  Vitals:   04/24/16 2222 04/25/16 0500  BP: 118/59 98/76  Pulse: 62   Resp: 17 16  Temp: 36.4 C 36.8 C    Last Pain:  Vitals:   04/25/16 0149  TempSrc:   PainSc: 10-Worst pain ever         Complications: No apparent anesthesia complications

## 2016-04-25 NOTE — Anesthesia Postprocedure Evaluation (Addendum)
Anesthesia Post Note  Patient: [name] Boyer  Procedure(s) Performed: Procedure(s) (LRB): CESAREAN SECTION (N/A)  Patient location during evaluation: PACU Anesthesia Type: MAC and Spinal Level of consciousness: awake and alert Pain management: pain level controlled Vital Signs Assessment: post-procedure vital signs reviewed and stable Respiratory status: spontaneous breathing and respiratory function stable Cardiovascular status: blood pressure returned to baseline and stable Postop Assessment: spinal receding Anesthetic complications: no        Last Vitals:  Vitals:   04/25/16 0632 04/25/16 0633  BP:    Pulse: 68 71  Resp: (!) 22 (!) 21  Temp:      Last Pain:  Vitals:   04/25/16 0630  TempSrc: Oral  PainSc: 2    Pain Goal:                 Jasson Siegmann DANIEL

## 2016-04-25 NOTE — Progress Notes (Signed)
Patient ID: [name] Boyer, female   DOB: 1975/08/30, 41 y.o.   MRN: [id number] Discussed circumcision with pt and husband: Pt does not want baby circumcised but husband enquired about cost. Will get information to them when office opens. Leaning more towards not getting done

## 2016-04-25 NOTE — Anesthesia Preprocedure Evaluation (Signed)
Anesthesia Evaluation  Patient identified by MRN, date of birth, ID band Patient awake    Reviewed: Allergy & Precautions, H&P , NPO status , Patient's Chart, lab work & pertinent test results  History of Anesthesia Complications Negative for: history of anesthetic complications  Airway Mallampati: II  TM Distance: >3 FB Neck ROM: full    Dental no notable dental hx.    Pulmonary neg pulmonary ROS,    Pulmonary exam normal        Cardiovascular negative cardio ROS Normal cardiovascular exam     Neuro/Psych negative neurological ROS  negative psych ROS   GI/Hepatic negative GI ROS, Neg liver ROS,   Endo/Other  negative endocrine ROS  Renal/GU negative Renal ROS     Musculoskeletal   Abdominal Normal abdominal exam  (+)   Peds  Hematology negative hematology ROS (+)   Anesthesia Other Findings   Reproductive/Obstetrics (+) Pregnancy                             Anesthesia Physical  Anesthesia Plan  ASA: II and emergent  Anesthesia Plan: MAC and Spinal   Post-op Pain Management:    Induction: Intravenous  Airway Management Planned:   Additional Equipment:   Intra-op Plan:   Post-operative Plan:   Informed Consent: I have reviewed the patients History and Physical, chart, labs and discussed the procedure including the risks, benefits and alternatives for the proposed anesthesia with the patient or authorized representative who has indicated his/her understanding and acceptance.     Plan Discussed with: CRNA and Surgeon  Anesthesia Plan Comments:         Anesthesia Quick Evaluation

## 2016-04-25 NOTE — Anesthesia Postprocedure Evaluation (Signed)
Anesthesia Post Note  Patient: [name] Boyer  Procedure(s) Performed: Procedure(s) (LRB): CESAREAN SECTION (N/A)  Patient location during evaluation: Mother Baby Anesthesia Type: MAC and Spinal Level of consciousness: awake and alert Pain management: pain level controlled Vital Signs Assessment: post-procedure vital signs reviewed and stable Respiratory status: spontaneous breathing and nonlabored ventilation Cardiovascular status: stable Postop Assessment: no headache, patient able to bend at knees, no backache, no signs of nausea or vomiting, spinal receding and adequate PO intake Anesthetic complications: no        Last Vitals:  Vitals:   04/25/16 0930 04/25/16 1030  BP: (!) 93/48 (!) 100/59  Pulse: (!) 57 65  Resp: 17 18  Temp:      Last Pain:  Vitals:   04/25/16 1030  TempSrc:   PainSc: 0-No pain   Pain Goal:                 [employer]

## 2016-04-25 NOTE — MAU Note (Signed)
Notified provider that patient did not make much change but I am not comfortable with the patient going home ctx every 2-3 mins nor is the patient. Provider said to started an IV bolus of LR and offer the patient 1mg  of stadol every Q3hrs and get a formal ultrasound and call her back with the results and she will come in to speak with the patient.

## 2016-04-25 NOTE — Op Note (Signed)
Operative Note    Preoperative Diagnosis: 1.Transverse lie/malpresentation  2. Latent labor  3. IUP at 39 1/7wks  Postoperative Diagnosis: 1. Frank breech lie/malpresentation  2. Same   3. same   Procedure: Primary low transverse cesarean section   Surgeon: C.Banga DO  Anesthesia: Spinal  Fluids: LR EBL: 800ml UOP: 300ml IVF: 2L  Findings: Grossly normal uterus, tubes and ovaries; female infant in frank breech presentation with loose nuchal x 1; apgars 9, 9; weight pending   Specimen: normal placenta sent to L/D   Procedure Note Consent verified preop   Patient was taken to the operating room where spinal anesthesia was administered and pt placed in dorsal supine position with a leftward tilt. Anesthesia was found to be adequate by Allis clamp test. She was prepped and draped in the normal sterile fashion after foley placed and an appropriate time out performed. A Pfannenstiel skin incision was then made with the scalpel 2 finger breaths above the pubic symphysis and carried through to the underlying layer of fascia by sharp dissection and Bovie cautery. The fascia was nicked in the midline and the incision was extended laterally with Mayo scissors. The superior and inferior aspects of the incision were grasped with Coker clamps and dissected off the underlying rectus muscles.Rectus muscles were separated in the midline and the peritoneal cavity entered bluntly. The peritoneal incision was then extended both superiorly and inferiorly with careful attention to avoid both bowel and bladder. The Alexis self-retaining wound retractor was then placed  and the lower uterine segment exposed. The bladder flap was developed with Metzenbaum scissors and pushed away from the lower uterine segment. The lower uterine segment was then incised in a transverse fashion and the cavity itself entered bluntly. Clear amniotic fluid was noted. The infant's was noted to be in frank breech position hence baby was  delivered in this position to the level of the scapula when legs were easily flexed and delivered. Baby's arms self delivered easily. Smelly-vite technique was used to deliver infants head next with loose nuchal x 1 reduced over infants head. Vigorous spontaneous cry was noted immediately on delivery. Parents witnessed as clear drape wss lowered for delivery.  The nose and mouth were bulb suctioned,  the cord clamped and cut after a minute and the infant was handed off to the waiting nicu staff. The placenta was then spontaneously expressed from the uterus and the uterus cleared of all clots and debris with moist lap sponges. The uterine incision was then repaired in 2 layers: the first layer was a running locked layer 1-0 chromic and the second an imbricating layer of the same suture. The tubes and ovaries were inspected and the gutters cleared of all clots and debris; copious irrigation performed.  The uterine incision was inspected and found to be hemostatic. All instruments and sponges as well as the Alexis retractor were then removed from the abdomen. The peritoneum was then reapproximated in a pursestring fashion and the rectus muscles in a large figure 8 stitch using 2-0 Vicryl. The fascia was then closed with 0 Vicryl in a running fashion from both ends and overlapping in the middle. Subcutaneous tissue was reapproximated with 3-0 plain in a running fashion. The skin was closed with a subcuticular stitch of 4-0 Vicryl on a Keith needle and then reinforced with benzoin and Steri-Strips and the honeycomb dressing with a pressure dressing over it. . At the conclusion of the procedure all instruments and sponge counts were correct. Patient was taken  to the recovery room in good condition with her baby accompanying her skin to skin.

## 2016-04-25 NOTE — Addendum Note (Signed)
Addendum  created 04/25/16 1419 by Elgie CongoNataliya H Posie Lillibridge, CRNA   Sign clinical note

## 2016-04-26 LAB — CBC WITH DIFFERENTIAL/PLATELET
Basophils Absolute: 0 10*3/uL (ref 0.0–0.1)
Basophils Relative: 0 %
Eosinophils Absolute: 0.1 10*3/uL (ref 0.0–0.7)
Eosinophils Relative: 1 %
HEMATOCRIT: 21.1 % — AB (ref 36.0–46.0)
Hemoglobin: 7.2 g/dL — ABNORMAL LOW (ref 12.0–15.0)
LYMPHS ABS: 1.8 10*3/uL (ref 0.7–4.0)
LYMPHS PCT: 16 %
MCH: 22.9 pg — AB (ref 26.0–34.0)
MCHC: 34.1 g/dL (ref 30.0–36.0)
MCV: 67 fL — AB (ref 78.0–100.0)
MONO ABS: 0.2 10*3/uL (ref 0.1–1.0)
MONOS PCT: 2 %
NEUTROS ABS: 9.2 10*3/uL — AB (ref 1.7–7.7)
Neutrophils Relative %: 82 %
Platelets: 150 10*3/uL (ref 150–400)
RBC: 3.15 MIL/uL — ABNORMAL LOW (ref 3.87–5.11)
RDW: 15.1 % (ref 11.5–15.5)
WBC: 11.3 10*3/uL — ABNORMAL HIGH (ref 4.0–10.5)

## 2016-04-26 LAB — CBC
HEMATOCRIT: 21 % — AB (ref 36.0–46.0)
HEMOGLOBIN: 7.3 g/dL — AB (ref 12.0–15.0)
MCH: 23 pg — ABNORMAL LOW (ref 26.0–34.0)
MCHC: 34.8 g/dL (ref 30.0–36.0)
MCV: 66 fL — AB (ref 78.0–100.0)
Platelets: 152 10*3/uL (ref 150–400)
RBC: 3.18 MIL/uL — ABNORMAL LOW (ref 3.87–5.11)
RDW: 14.9 % (ref 11.5–15.5)
WBC: 11.1 10*3/uL — AB (ref 4.0–10.5)

## 2016-04-26 LAB — RPR: RPR Ser Ql: NONREACTIVE

## 2016-04-26 MED ORDER — FERROUS SULFATE 325 (65 FE) MG PO TABS
325.0000 mg | ORAL_TABLET | Freq: Every day | ORAL | Status: DC
  Administered 2016-04-26: 325 mg via ORAL
  Filled 2016-04-26: qty 1

## 2016-04-26 MED ORDER — LACTATED RINGERS IV SOLN
INTRAVENOUS | Status: DC
  Administered 2016-04-26: 16:00:00 via INTRAVENOUS

## 2016-04-26 MED ORDER — LACTATED RINGERS IV BOLUS (SEPSIS)
1000.0000 mL | Freq: Once | INTRAVENOUS | Status: AC
  Administered 2016-04-26: 1000 mL via INTRAVENOUS

## 2016-04-26 NOTE — Lactation Note (Signed)
This note was copied from a baby's chart. Lactation Consultation Note  Patient Name: [name] Boyer'XToday's Date: 04/26/2016 Reason for consult: Follow-up assessment  Baby 36 hours old. Mom reports that she is putting the baby to the breast first and then giving formula. Mom states that she does not need any assistance at this time.   Maternal Data    Feeding    LATCH Score/Interventions                      Lactation Tools Discussed/Used     Consult Status Consult Status: PRN    Sherlyn HayJennifer D Iylah Dworkin 04/26/2016, 4:33 PM

## 2016-04-26 NOTE — Progress Notes (Signed)
CC: Headache  Subjective: 41yo female POD #1 s/p C-section due to breech presentation complaining of headache that began this AM.  States she noticed headache after ambulating in hallway that has not subsided despite PO oxycodone.  Patient denies improvement with supine positioning or worsening of headache when sitting or standing.  Describes headache as throbbing sensation of entire head and constant in nature.  Denies photophobia.  OBGYN noted post-op anemia, and patient declined blood transfusion as of this AM.  Objective:  Vitals:   04/26/16 0400 04/26/16 1010  BP: (!) 90/56 (!) 97/53  Pulse: 60 78  Resp: 18 18  Temp: 36.6 C 36.6 C   PE: General: alert and cooperative sitting up in bed CV: RRR Chest: CTAB  A/P: Patient describing new onset headache this AM and generalized body weakness in setting of post-op anemia.  Doubtful that this headache is a post dural puncture headache.  Discussed with patient nature of PDPH and low likelihood of spinal anesthesia that she received being the culprit for her headache given her description of the headache.  -Suggest adding PO Fioricet for headache.   -Encourage aggressive hydration (PO or IV), caffeine intake.

## 2016-04-26 NOTE — Progress Notes (Signed)
Subjective: Postpartum Day 1: Cesarean Delivery Patient reports incisional pain, tolerating PO and no problems voiding.  Patient has a headache this AM that started when she began walking--but same at rest.  No flatus yet.  Objective: Vital signs in last 24 hours: Temp:  [97.5 F (36.4 C)-98.2 F (36.8 C)] 97.8 F (36.6 C) (02/24 0400) Pulse Rate:  [53-65] 60 (02/24 0400) Resp:  [16-18] 18 (02/24 0400) BP: (90-100)/(45-59) 90/56 (02/24 0400) SpO2:  [95 %-98 %] 95 % (02/23 1920)  Physical Exam:  General: alert and cooperative Lochia: appropriate Uterine Fundus: firm Incision: C/D/I    Recent Labs  04/25/16 0115 04/26/16 0540  HGB 11.9* 7.3*  HCT 35.0* 21.0*    Assessment/Plan: Pt is anemic but able to ambulate, d/w pt transfusion but declines for now  Does not seem to be a spinal HA, but will continue to observe Bottlefeeding and breastfeeding   Siddarth Hsiung W 04/26/2016, 9:30 AM

## 2016-04-27 LAB — CBC
HEMATOCRIT: 22.2 % — AB (ref 36.0–46.0)
HEMOGLOBIN: 7.5 g/dL — AB (ref 12.0–15.0)
MCH: 22.8 pg — ABNORMAL LOW (ref 26.0–34.0)
MCHC: 33.8 g/dL (ref 30.0–36.0)
MCV: 67.5 fL — AB (ref 78.0–100.0)
PLATELETS: 165 10*3/uL (ref 150–400)
RBC: 3.29 MIL/uL — AB (ref 3.87–5.11)
RDW: 15 % (ref 11.5–15.5)
WBC: 11.4 10*3/uL — AB (ref 4.0–10.5)

## 2016-04-27 LAB — PREPARE RBC (CROSSMATCH)

## 2016-04-27 MED ORDER — DIPHENHYDRAMINE HCL 25 MG PO CAPS
25.0000 mg | ORAL_CAPSULE | Freq: Once | ORAL | Status: AC
  Administered 2016-04-27: 25 mg via ORAL
  Filled 2016-04-27: qty 1

## 2016-04-27 MED ORDER — FERROUS SULFATE 325 (65 FE) MG PO TABS
325.0000 mg | ORAL_TABLET | Freq: Two times a day (BID) | ORAL | Status: DC
  Administered 2016-04-27 – 2016-04-28 (×2): 325 mg via ORAL
  Filled 2016-04-27 (×2): qty 1

## 2016-04-27 MED ORDER — SODIUM CHLORIDE 0.9 % IV SOLN
Freq: Once | INTRAVENOUS | Status: AC
  Administered 2016-04-27: 10 mL/h via INTRAVENOUS

## 2016-04-27 NOTE — Progress Notes (Addendum)
  Postpartum Day 2 Cesarean Delivery Patient reports HA is back this AM.  States got better last PM with IV hydration and oxycodone, but back this AM.  A bit worse with walking but has at rest as well.  +flatus and BM, incisional pain well-controlled  Objective: Vital signs in last 24 hours: Temp:  [97.9 F (36.6 C)-99.1 F (37.3 C)] 98.2 F (36.8 C) (02/25 0607) Pulse Rate:  [62-78] 62 (02/25 0607) Resp:  [16-18] 17 (02/25 0607) BP: (93-97)/(52-56) 95/56 (02/25 0607) SpO2:  [99 %] 99 % (02/24 1805)  Physical Exam:  General: cooperative, appears slightly uncomfortable Lochia: appropriate Uterine Fundus: firm Incision: clean and intact, pressure dressing removed and honeycomb has some dried blood, but otherwise intact    Recent Labs  04/26/16 1523 04/27/16 0509  HGB 7.2* 7.5*  HCT 21.1* 22.2*    Assessment/Plan: Postoperative course complicated by headache and anemia  Not clear if headache is totally from anemia or may have some spinal component as is worse with standing. D/w pt options of having anesthesia reassess for blood patch vs caffeine vs transfusion. IV infiltrated and removed.  Pt wants to try po caffeine and hydration.  Has also been about 7 hours since last oxycodone, so will try that as well. If no improvement in next 3-4 hours will see if anesthesia will reassess.  Oliver PilaRICHARDSON,Adreona Brand W 04/27/2016, 9:35 AM

## 2016-04-27 NOTE — Progress Notes (Signed)
Patient ID: [name] Boyer, female   DOB: [birth year], 41 y.o.   MRN: [id number] Pt's HA has improved intermittently over course of day, but as meds wear off tends to return.  Is a bit worse with standing and walking, but when present has at rest too.   D/w pt likely more from volume depletion and anemia than spinal HA, but will ask anesthesia to weigh in their opinion as well.  Pt now would like to proceed with transfusion to see if will improve how she is feeling.  D/w pt the risks and benefits of transfusion including low risk of infection with HIV, Hepatitis viruses but that with screening practices now, that risk is very low.    Will order 2 units PRBC's for transfusion and ask anesthesia to reassess and make sure they agree not a spinal component to HA.

## 2016-04-28 ENCOUNTER — Encounter (HOSPITAL_COMMUNITY): Payer: Self-pay | Admitting: Anesthesiology

## 2016-04-28 LAB — TYPE AND SCREEN
ABO/RH(D): B POS
Antibody Screen: NEGATIVE
UNIT DIVISION: 0
Unit division: 0

## 2016-04-28 MED ORDER — OXYCODONE HCL 5 MG PO TABS: 5.0000 mg | ORAL_TABLET | ORAL | 0 refills | Status: AC | PRN

## 2016-04-28 MED ORDER — IBUPROFEN 600 MG PO TABS: 600.0000 mg | ORAL_TABLET | Freq: Four times a day (QID) | ORAL | 0 refills | Status: AC

## 2016-04-28 NOTE — Discharge Instructions (Signed)
As per discharge pamphlet °

## 2016-04-28 NOTE — Lactation Note (Signed)
This note was copied from a baby's chart. Lactation Consultation Note Mom's 3rd baby, states BF going well. Mom's breast exposed, baby had been BF, sleeping now. Mom has been BF/formula feeding.  Mom states that she is good and doesn't need to see lactation, she is going home today. Reminded mom of LC out pt. Services and resources.   Patient Name: Boy Tory EmeraldZila Rohner ONGEX'BToday's Date: 04/28/2016 Reason for consult: Follow-up assessment   Maternal Data    Feeding Feeding Type: Breast Fed Length of feed: 15 min  LATCH Score/Interventions       Type of Nipple: Everted at rest and after stimulation              Lactation Tools Discussed/Used     Consult Status Consult Status: Complete Date: 04/28/16    Charyl DancerCARVER, Koal Eslinger G 04/28/2016, 6:32 AM

## 2016-04-28 NOTE — Progress Notes (Signed)
Follow Up: POD #3 Headache s/p Spinal Anesthetic (C/S)  S: Pt complains of mild improvement. She states her HA is present even when lying down and does not get worse with standing up. She denies visual and auditory symptoms. She states she consumed many cups of coffee per day prior to hospitalization. I explained this could be contributing but I could not say definitively.   O: AF, VSS Gen: Mild HA CV: RRR Pulm: CTAB Back: no erythema, no drainage, no pain  A/P: Current findings inconsistent with PDPH but it can not be ruled out at this time. I explained management options to Ms. [name] Boyer in detail.   She would like to go home with conservative management (i.e. Rest, hydration, caffeine). If her HA persists or worsens over the next 5 days, I instructed her to return to hospital and we will consider other measures (i.e. Epidural blood patch).    [name] LayerKevin D. Hart RochesterHollis, MD, [facility] Anesthesiology

## 2016-04-28 NOTE — Progress Notes (Signed)
POD #3 LTCS Was feeling ok, now has headache again once up and moving, also has neck pain, says headache maybe a tiny bit better than yesterday Afeb, VSS Abd- soft, fundus firm, incision intact Hgb up to 9.8 from 7.5 after 2 u PRBC Will continue PO hydration, will have anesthesia re-evaluate for possible spinal headache.  I will check on her this afternoon to see if she feels ok to go home.

## 2016-04-28 NOTE — Discharge Summary (Addendum)
OB Discharge Summary     Patient Name: [name] Boyer DOB: 09/17/1975 MRN: [id number]  Date of admission: 04/24/2016 Delivering MD: Pryor Ochoa [facility]   Date of discharge: 04/28/2016  Admitting diagnosis: 39w ctx 2-5 min Intrauterine pregnancy: [redacted]w[redacted]d     Secondary diagnosis:  Active Problems:   Transverse lie of fetus   Status post primary low transverse cesarean section   Postpartum care following cesarean delivery  Additional problems: Possible spinal headache     Discharge diagnosis: Term Pregnancy Delivered                                    Hospital course:  Sceduled C/S   41 y.o. yo [id number] at [redacted]w[redacted]d was admitted to the hospital 04/24/2016 for scheduled cesarean section with the following indication:Malpresentation.  Membrane Rupture Time/Date:   ,    Patient delivered a Viable infant.04/25/2016  Details of operation can be found in separate operative note.  She had a persistent headache, anesthesia saw her to r/o spinal headache.  She was transfused 2 units PRBC for acute blood loss anemia and to see if this would help with headache.  She continued to have headache when out of bed, but requested discharge.  She is ambulating, tolerating a regular diet, passing flatus, and urinating well. Patient is discharged home in stable condition on  04/28/16         Physical exam  Vitals:   04/27/16 2119 04/27/16 2200 04/27/16 2337 04/28/16 0500  BP: 117/64 123/69 (!) 128/52 115/68  Pulse: (!) 57 (!) 58 (!) 53 (!) 56  Resp: 20 20 20 20   Temp: 98.6 F (37 C) 98.8 F (37.1 C) 98.4 F (36.9 C) 98.2 F (36.8 C)  TempSrc: Oral Oral Oral Oral  SpO2: 100% 100% 100%   Weight:      Height:       General: alert Lochia: appropriate Uterine Fundus: firm Incision: Healing well with no significant drainage  Labs: Lab Results  Component Value Date   WBC 9.2 04/28/2016   HGB 9.8 (L) 04/28/2016   HCT 28.8 (L) 04/28/2016   MCV 69.6 (L) 04/28/2016   PLT 186 04/28/2016   CMP Latest Ref  Rng & Units 06/02/2014  Glucose 70 - 99 mg/dL 94  BUN 6 - 23 mg/dL 9  Creatinine 5.36 - 6.44 mg/dL 0.34(V)  Sodium 425 - 956 mmol/L 136  Potassium 3.5 - 5.1 mmol/L 3.6  Chloride 96 - 112 mmol/L 107  CO2 19 - 32 mmol/L 24  Calcium 8.4 - 10.5 mg/dL 8.6  Total Protein 6.0 - 8.3 g/dL 6.8  Total Bilirubin 0.3 - 1.2 mg/dL 0.5  Alkaline Phos 39 - 117 U/L 52  AST 0 - 37 U/L 15  ALT 0 - 35 U/L 11    Discharge instruction: per After Visit Summary and "Baby and Me Booklet".  After visit meds:  Allergies as of 04/28/2016   No Known Allergies     Medication List    TAKE these medications   ferrous sulfate 325 (65 FE) MG tablet Take 325 mg by mouth daily with breakfast.   ibuprofen 600 MG tablet Commonly known as:  ADVIL,MOTRIN Take 1 tablet (600 mg total) by mouth every 6 (six) hours.   oxyCODONE 5 MG immediate release tablet Commonly known as:  Oxy IR/ROXICODONE Take 1 tablet (5 mg total) by mouth every 4 (four) hours as needed for severe  pain.   prenatal multivitamin Tabs tablet Take 1 tablet by mouth at bedtime.       Diet: routine diet  Activity: Advance as tolerated. Pelvic rest for 6 weeks.   Outpatient follow up:2 weeks   Newborn Data: Live born female  Birth Weight: 7 lb 3 oz (3260 g) APGAR: 8, 9  Baby Feeding: Breast Disposition:home with mother   04/28/2016 Zenaida NieceMEISINGER,Aimy Sweeting D, MD

## 2016-04-30 ENCOUNTER — Inpatient Hospital Stay (HOSPITAL_COMMUNITY): Admission: RE | Admit: 2016-04-30 | Payer: BLUE CROSS/BLUE SHIELD | Source: Ambulatory Visit

## 2016-05-01 LAB — CBC
HEMATOCRIT: 28.8 % — AB (ref 36.0–46.0)
Hemoglobin: 9.8 g/dL — ABNORMAL LOW (ref 12.0–15.0)
MCH: 23.7 pg — AB (ref 26.0–34.0)
MCHC: 34 g/dL (ref 30.0–36.0)
MCV: 69.6 fL — ABNORMAL LOW (ref 78.0–100.0)
PLATELETS: 186 10*3/uL (ref 150–400)
RBC: 4.14 MIL/uL (ref 3.87–5.11)
RDW: 16.5 % — ABNORMAL HIGH (ref 11.5–15.5)
WBC: 9.2 10*3/uL (ref 4.0–10.5)

## 2016-05-02 ENCOUNTER — Other Ambulatory Visit (HOSPITAL_COMMUNITY): Payer: Self-pay | Admitting: Obstetrics and Gynecology

## 2016-05-02 ENCOUNTER — Ambulatory Visit (HOSPITAL_COMMUNITY)
Admission: RE | Admit: 2016-05-02 | Discharge: 2016-05-02 | Disposition: A | Discharge status: Home / Self Care | Payer: BLUE CROSS/BLUE SHIELD | Source: Ambulatory Visit | Attending: Family Medicine | Admitting: Family Medicine

## 2016-05-02 DIAGNOSIS — M79605 Pain in left leg: Secondary | ICD-10-CM | POA: Diagnosis not present

## 2016-05-02 DIAGNOSIS — M7989 Other specified soft tissue disorders: Secondary | ICD-10-CM

## 2016-05-02 DIAGNOSIS — R600 Localized edema: Secondary | ICD-10-CM | POA: Diagnosis present

## 2016-05-02 NOTE — Progress Notes (Signed)
VASCULAR LAB PRELIMINARY  PRELIMINARY  PRELIMINARY  PRELIMINARY  Left lower extremity venous duplex completed.    Preliminary report:  Left:  No evidence of DVT, superficial thrombosis, or Baker's cyst.  Khristen Cheyney, RVS 05/02/2016, 3:50 PM

## 2016-10-11 IMAGING — US US OB COMP LESS 14 WK
1 series · 15 of 28 positions shown · non-contrast
Comparison: Non for this pregnancy.

CLINICAL DATA: Vaginal bleeding in a first-trimester pregnancy.

EXAM:
OBSTETRIC <14 WK ULTRASOUND
TECHNIQUE: Transabdominal ultrasound was performed for evaluation of the
gestation as well as the maternal uterus and adnexal regions.

[Series 1: us ob comp less 14 wk · 15 of 35 slices shown]
[im 1/35]
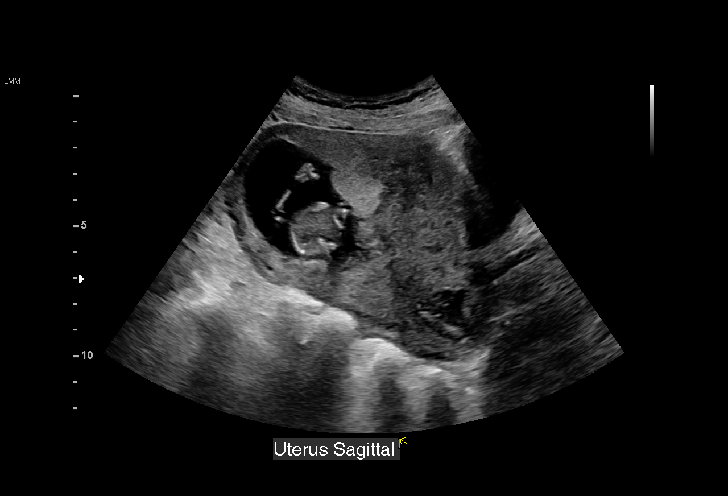
[im 3/35]
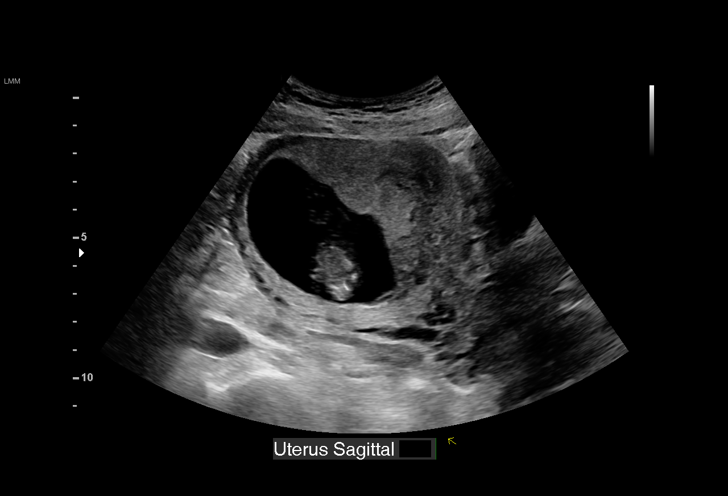
[im 6/35]
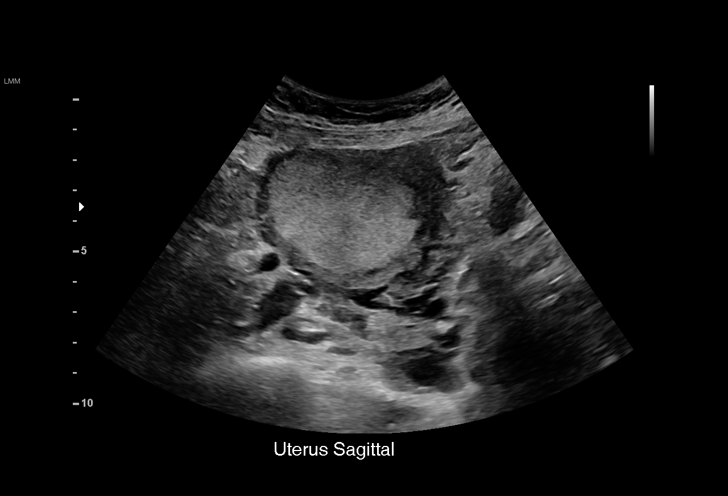
[im 8/35]
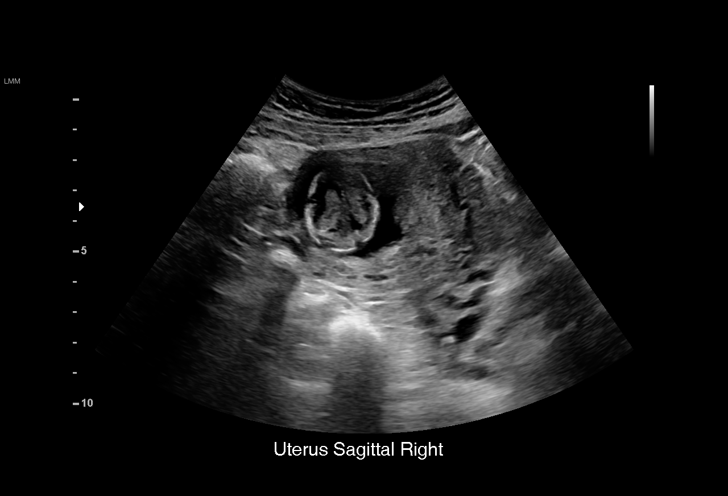
[im 11/35]
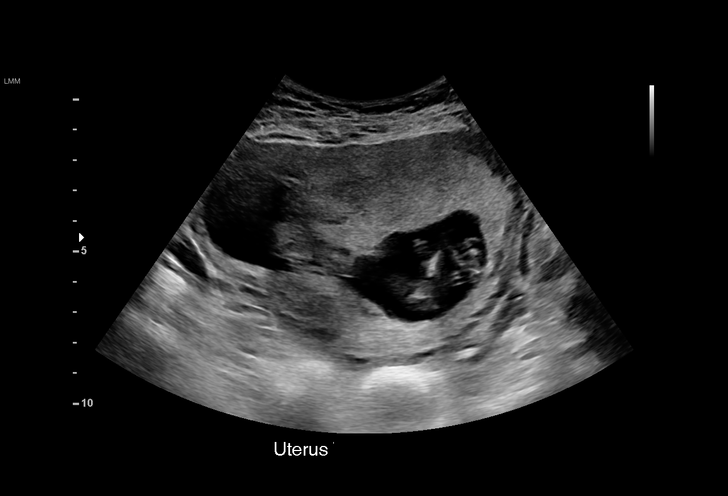
[im 13/35]
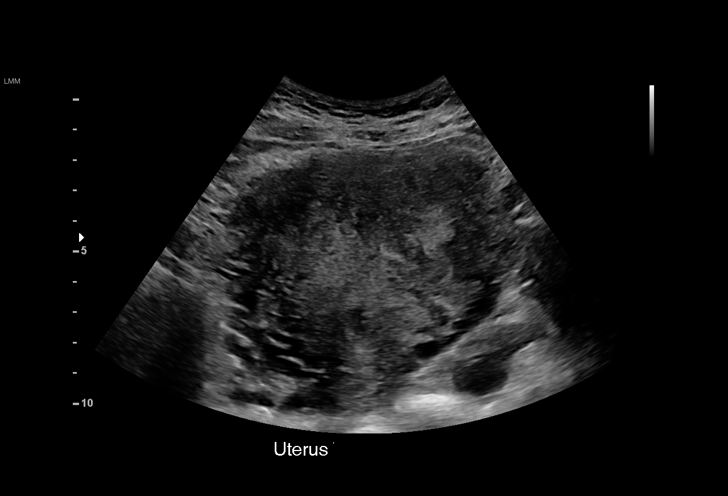
[im 16/35]
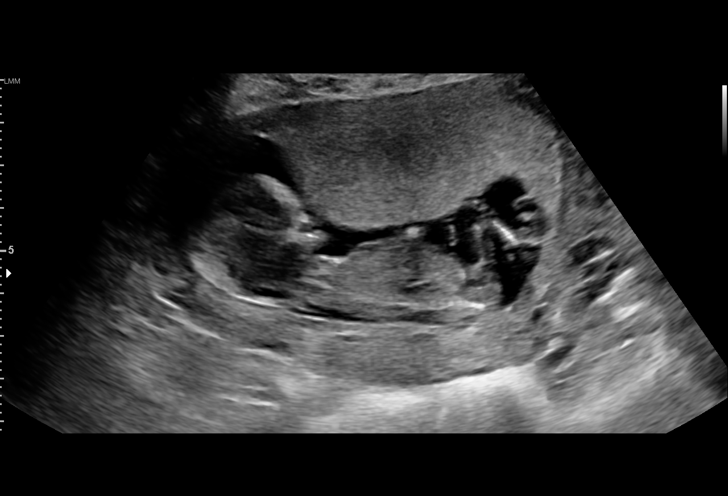
[im 18/35]
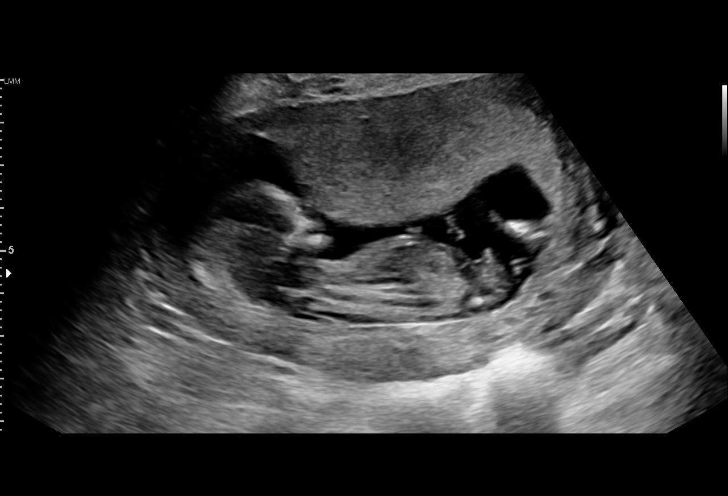
[im 19/35]
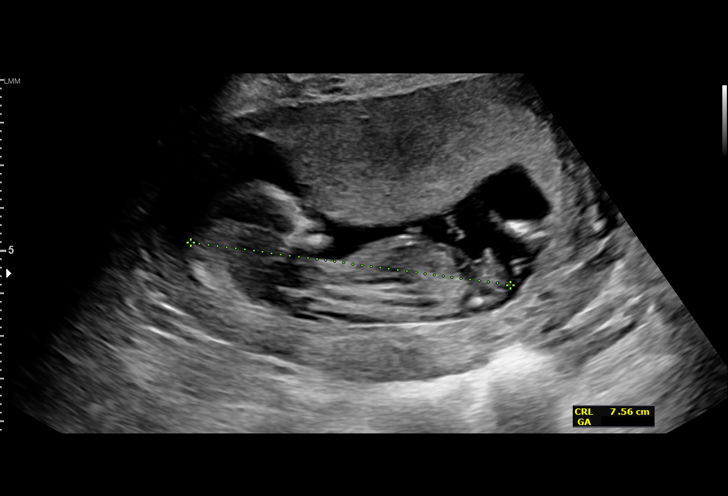
[im 22/35]
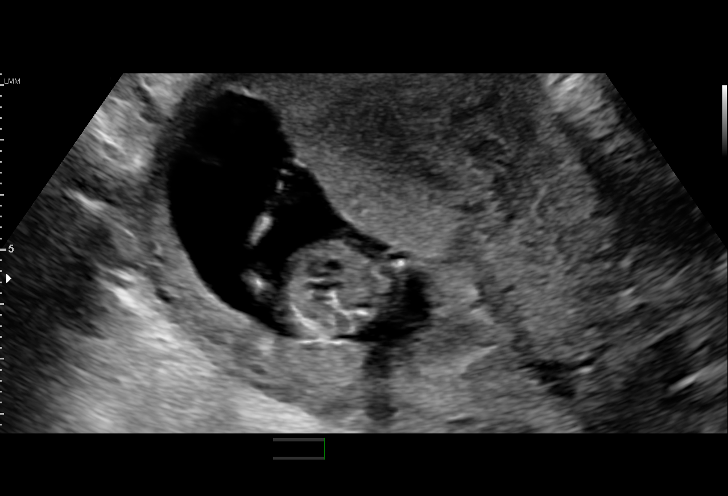
[im 24/35]
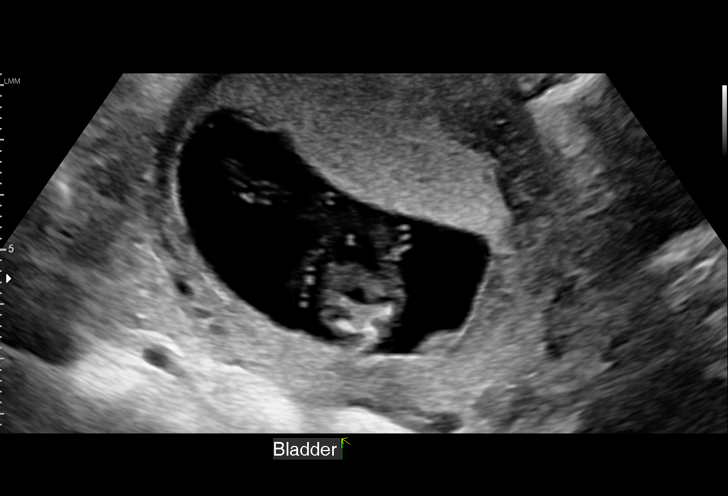
[im 27/35]
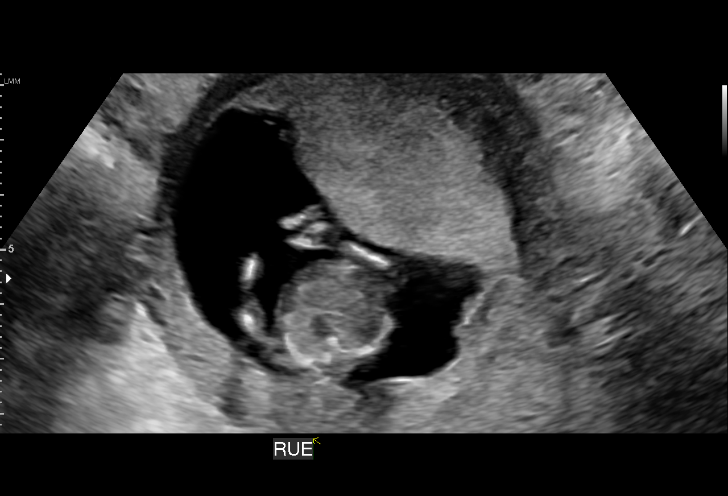
[im 29/35]
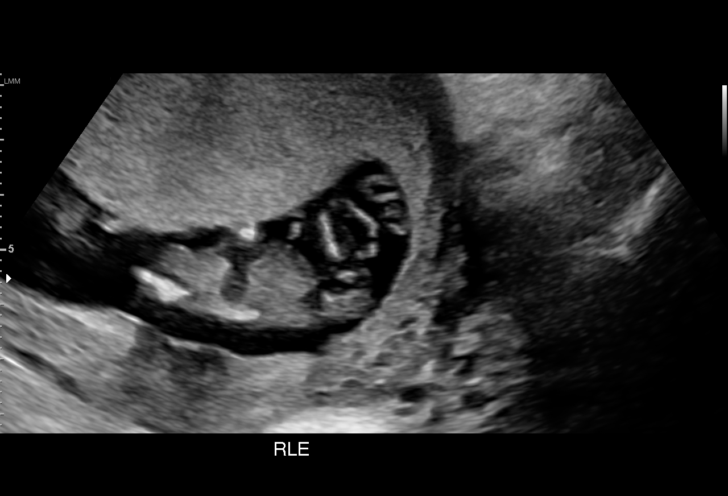
[im 32/35]
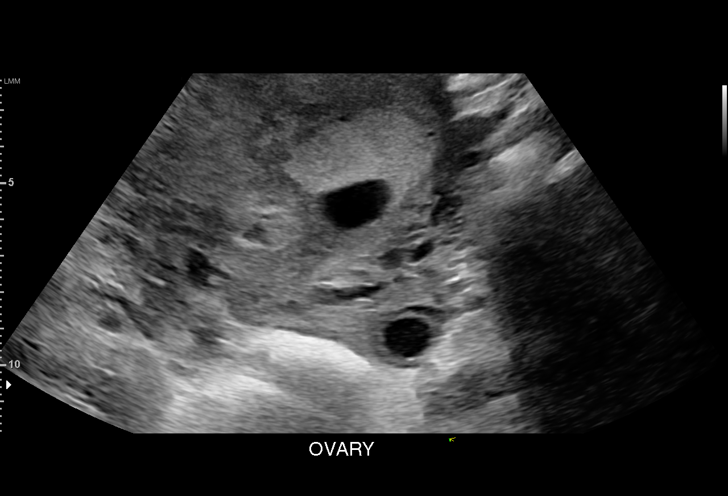
[im 35/35]
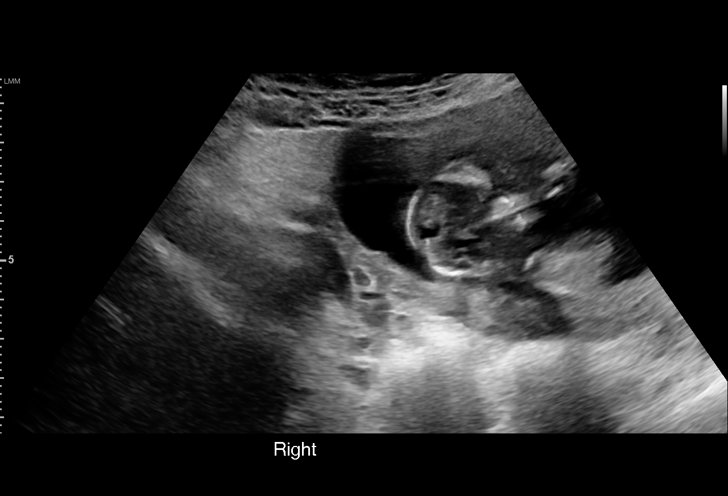

[15 of 28 positions shown; findings below may reference images not displayed]

FINDINGS: Intrauterine gestational sac: Present.

Yolk sac:  Not seen.

Embryo:  Present.

Cardiac Activity: Present.

Heart Rate: 154 bpm

CRL:   7.5 cm  mm   13 w 4 d                  US EDC: 04/30/2016

Subchorionic hemorrhage:  None visualized.

Maternal uterus/adnexae: Right ovary is not seen. Left ovary is
normal.
IMPRESSION: Single live intrauterine pregnancy at 13 weeks and 4 days gestation
by crown-rump length measurement.

No evidence of subchorionic hemorrhage.

Normal appearance of the left ovary.

Right ovary was not visualized.
